# Patient Record
Sex: Female | Born: 1980 | Race: White | Hispanic: No | Marital: Single | Smoking: Current every day smoker
Health system: Southern US, Community
[De-identification: ages and names within clinical notes are randomized; demographics above are authoritative.]

## PROBLEM LIST (undated history)

## (undated) DIAGNOSIS — I10 Essential (primary) hypertension: Secondary | ICD-10-CM

## (undated) DIAGNOSIS — K219 Gastro-esophageal reflux disease without esophagitis: Secondary | ICD-10-CM

## (undated) HISTORY — PX: BACK SURGERY: SHX140

## (undated) HISTORY — DX: Gastro-esophageal reflux disease without esophagitis: K21.9

---

## 1999-09-09 ENCOUNTER — Emergency Department (HOSPITAL_COMMUNITY): Admission: EM | Admit: 1999-09-09 | Discharge: 1999-09-09 | Payer: Self-pay | Admitting: Emergency Medicine

## 2007-08-01 ENCOUNTER — Ambulatory Visit (HOSPITAL_COMMUNITY): Admission: RE | Admit: 2007-08-01 | Discharge: 2007-08-01 | Payer: Self-pay | Admitting: Neurological Surgery

## 2010-07-30 IMAGING — CR DG CHEST 2V
1 series · 2 of 2 positions shown · non-contrast
Comparison: NONE

CLINICAL DATA: Attn. TIGER, ABIMELK  Intermittent chest 
pain  for 3 weeks. 

CHEST TWO VIEW (PA AND LATERAL)

[Series 1: view not recorded · 0.17mm/px · 2 of 2 slices shown]
[im 1/2]
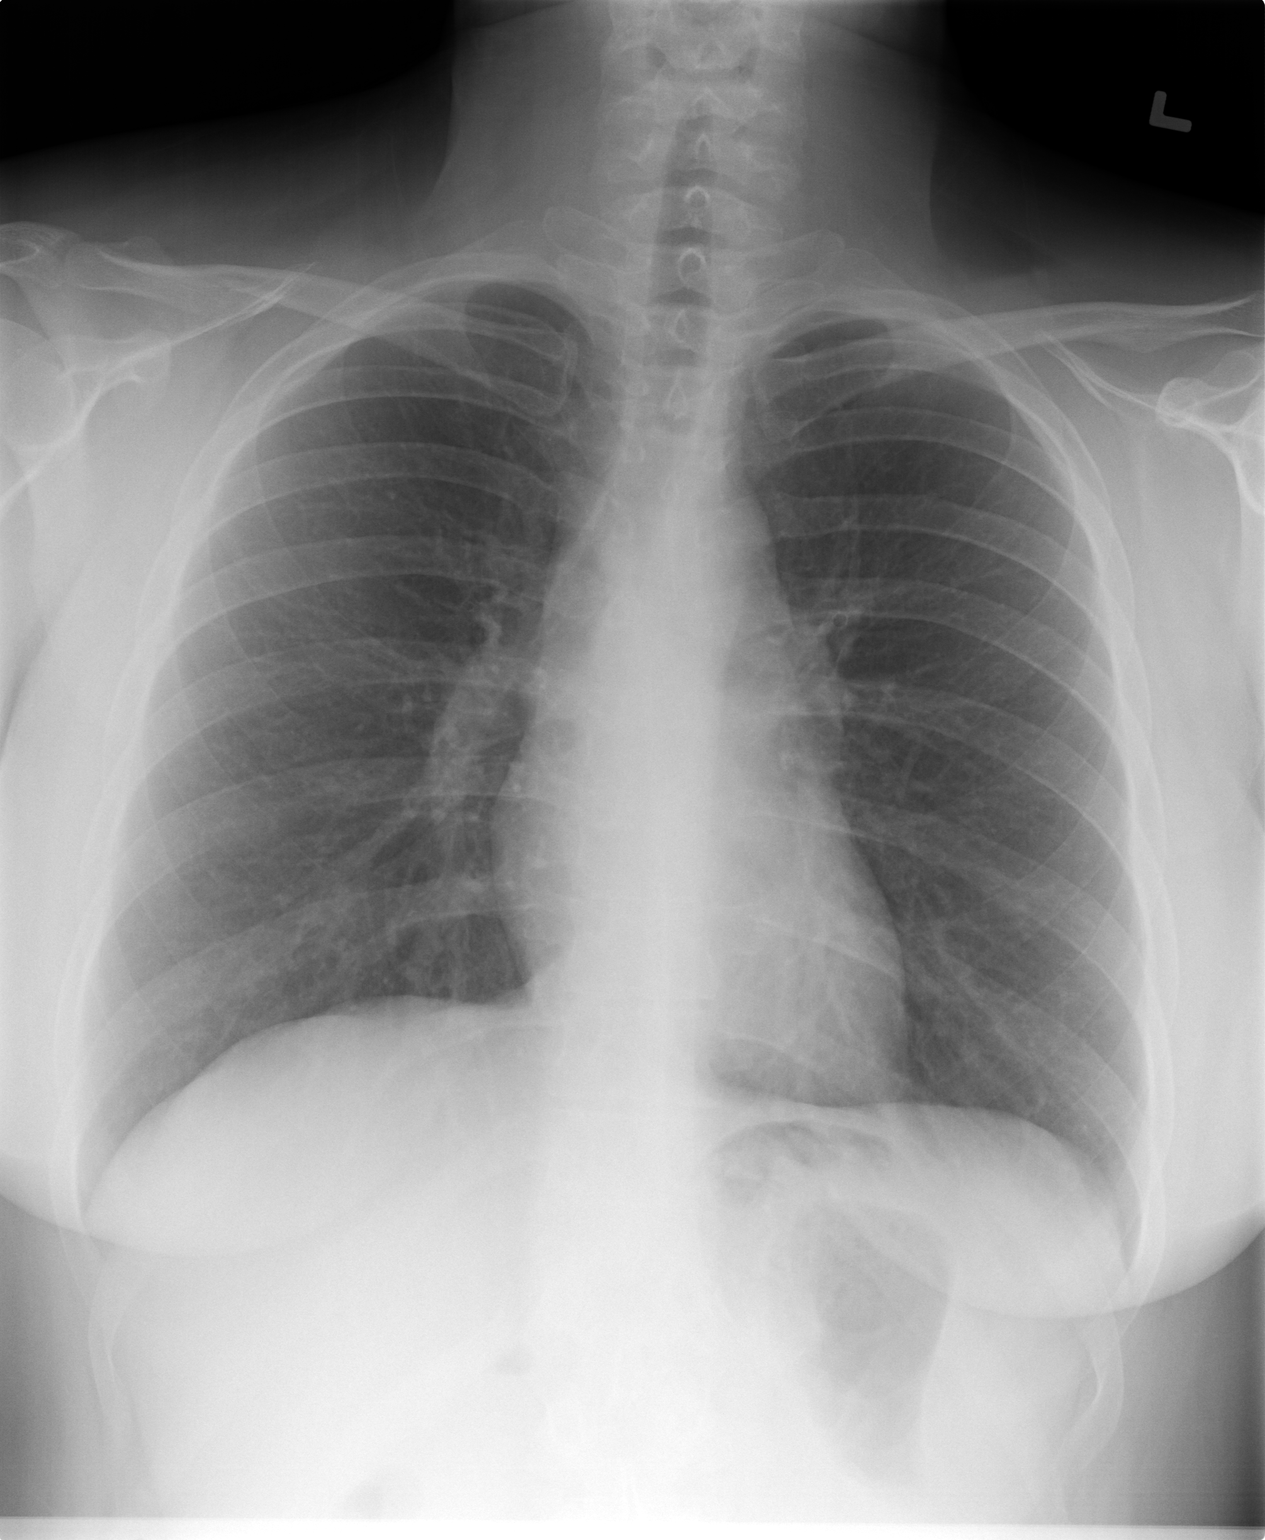
[im 2/2]
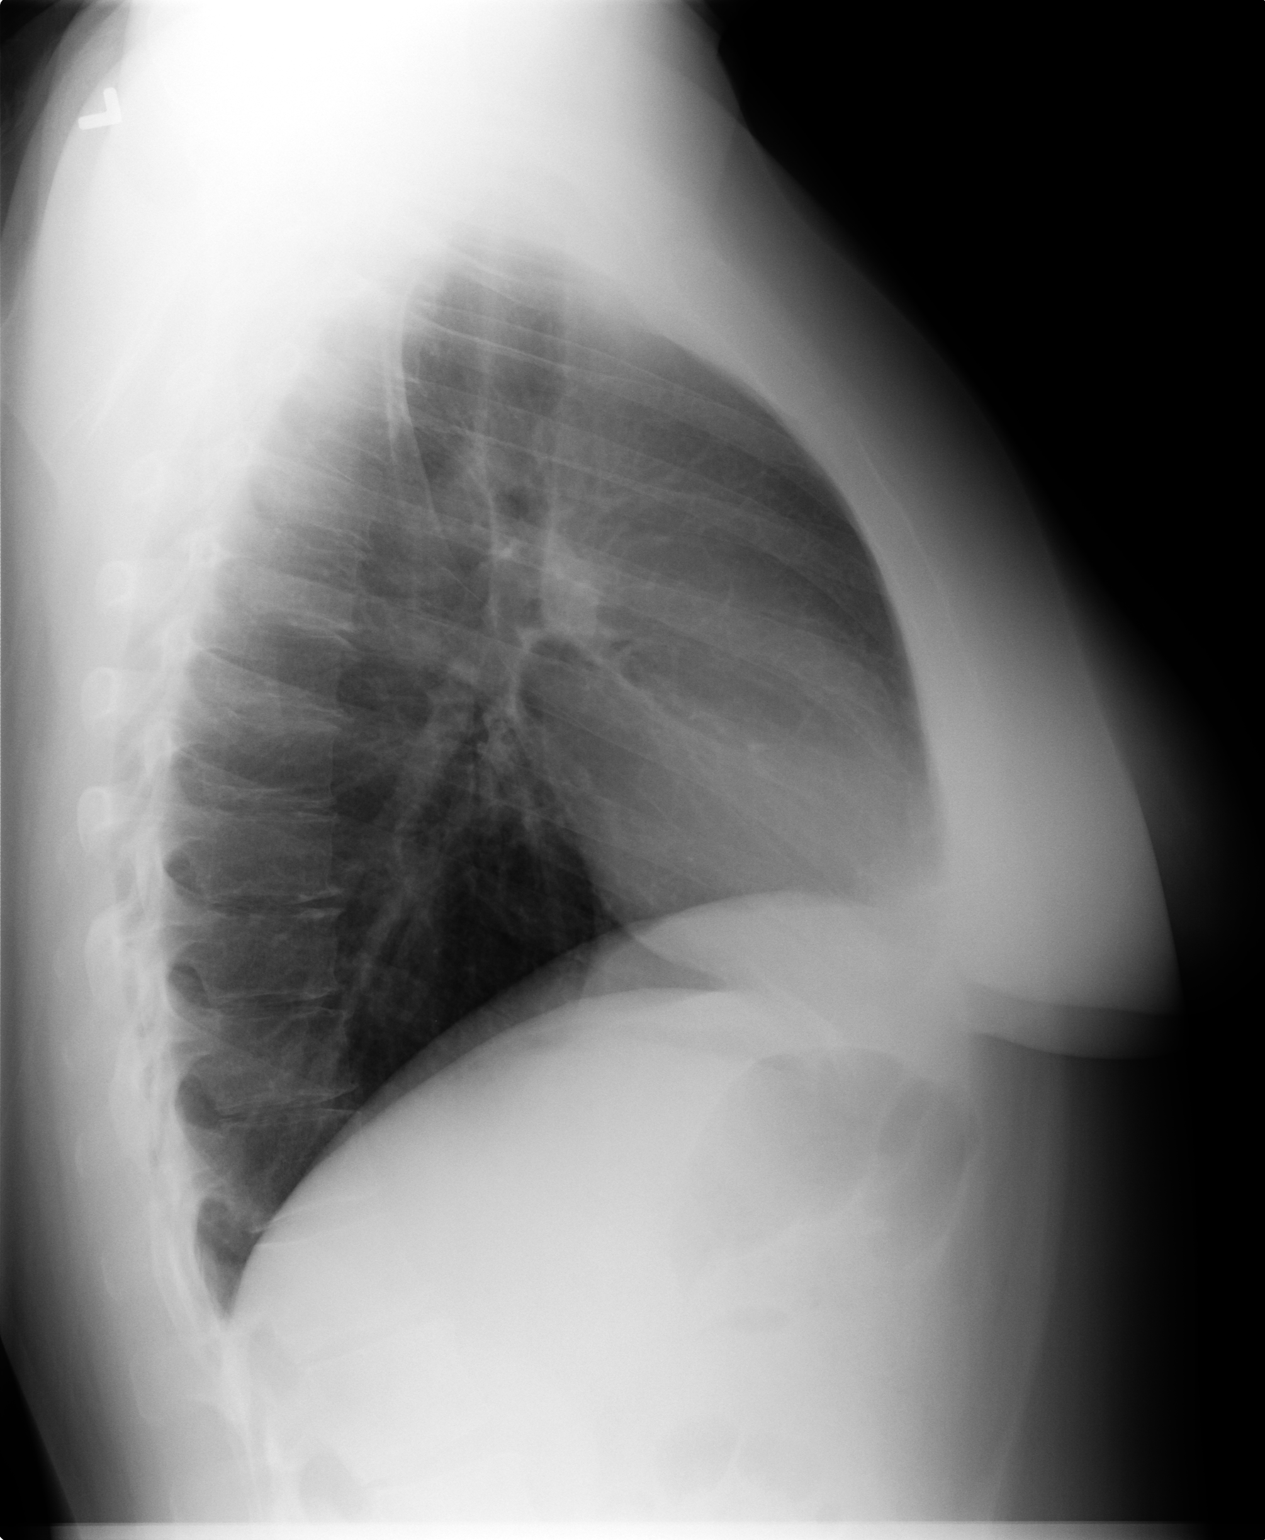

[2 of 2 positions shown; findings below may reference images not displayed]

FINDINGS: The lungs are clear and well expanded.   Heart and 
pulmonary vessels are normal.  No significant abnormalities are 
noted in the regional skeleton. 

electronically reviewed on 04/07/2008 Dict Date: 04/06/2008  Tran 
Date:  04/07/2008 DAS  [REDACTED]

## 2010-08-30 NOTE — Op Note (Signed)
NAMEJULIEANN, Barbara Romero NO.:  [id number]   MEDICAL RECORD NO.:  [id number]          PATIENT TYPE:  OIB   LOCATION:  3599                         FACILITY:  [facility]   PHYSICIAN:  Tia Alert, MD     DATE OF BIRTH:  05-05-1980   DATE OF PROCEDURE:  DATE OF DISCHARGE:                               OPERATIVE REPORT   PREOPERATIVE DIAGNOSIS:  Lumbar disk herniation, L5-S1 with left S1  radiculopathy.   POSTOPERATIVE DIAGNOSIS:  Lumbar disk herniation, L5-S1 with left S1  radiculopathy.   PROCEDURES:  Decompressive lumbar hemilaminectomy, medial facetectomy  and foraminotomy, L5-S1 with left followed by microdiskectomy, and L5-S1  with left utilizing microscopic dissection.   SURGEON:  Tia Alert, MD.   ASSISTANT:  Reinaldo Meeker, MD.   ANESTHESIA:  General endotracheal.   COMPLICATIONS:  None apparent.   INDICATIONS FOR PROCEDURE:  Barbara Romero is a 30 year old female who  presented with severe left leg pain in S1 distribution.  She had an MRI,  which showed a very large midline disk herniation at L5-S1, causing  severe canal stenosis and compression of the S1 nerve root.  I  recommended a microdiskectomy at L5-S1 left, in hopes of improving her  pain syndrome.  She understood the risks, benefits, and its expected  outcome and wished to proceed.   DESCRIPTION OF PROCEDURE:  The patient was taken to the operating room.  After induction of adequate generalized endotracheal anesthesia, she was  rolled onto prone position on the Wilson frame.  All pressure points  were padded.  The lumbar region was prepped with DuraPrep and then  draped in the usual sterile fashion.  A 5 mL of local anesthesia was  injected and a small dorsal midline incision was made and carried down  to the lumbosacral fascia.  The fascia was opened and the paraspinous  musculature was taken down in subperiosteal fashion to expose L5-S1 on  the left.  Intraoperative X-ray confirmed my  level, and then used the  high-speed drill and a Kerrison punch to perform a hemilaminectomy,  medial facetectomy, and foraminotomy at L5-S1 on the left side.  The  underlying yellow ligament was opened and removed in a piecemeal fashion  to expose the underlying dura and S1 nerve root.  The S1 nerve root was  retracted medially and utilizing microscopic dissection, the epidural  venous vasculature was coagulated and cut sharply and then, the disk was  incised and the diskectomy was done with pituitary rongeurs and Epstein  curettes.  There was a large midline herniation and that was removed  with an upbiting pituitary.  That relaxed the dura and the nerve root.  We then inspected with a nerve hook and a coronary dilator into the  midline and into the foramen.  We had a nice flat annulus with a nerve  root, which appeared to have no more compression.  We irrigated with  saline solution containing bacitracin, lined the dura with DuraGen to  help prevent epidural fibrosis, and then closed the fascia and the fatty  tissues with 0 Vicryl,  closed the subcutaneous and subcuticular  tissue with 2-0 and 3-0 Vicryl, closed the skin with Benzoin and Steri-  Strips.  The drapes were removed.  Sterile dressing was applied.  The  patient was awakened from general anesthesia and transferred to recovery  room in stable condition.  At the end of the procedure, all sponge,  needle, and instrument counts were correct.      Tia Alert, MD  Electronically Signed     DSJ/MEDQ  D:  08/01/2007  T:  08/02/2007  Job:  [id number]

## 2011-01-10 LAB — DIFFERENTIAL
Eosinophils Relative: 4
Lymphocytes Relative: 26
Lymphs Abs: 2.8

## 2011-01-10 LAB — BASIC METABOLIC PANEL
BUN: 11
GFR calc Af Amer: >60
GFR calc non Af Amer: >60
Potassium: 4.4
Sodium: 137

## 2011-01-10 LAB — HEPATIC FUNCTION PANEL
ALT: 25
Albumin: 3.8
Alkaline Phosphatase: 57
Indirect Bilirubin: 0.7
Total Protein: 7.3

## 2011-01-10 LAB — APTT: aPTT: 30

## 2011-01-10 LAB — CBC
HCT: 43
Platelets: 257
WBC: 10.9 — ABNORMAL HIGH

## 2013-06-29 ENCOUNTER — Emergency Department (HOSPITAL_COMMUNITY)
Admission: EM | Admit: 2013-06-29 | Discharge: 2013-06-29 | Disposition: A | Discharge status: Home / Self Care | Payer: BC Managed Care – PPO | Attending: Emergency Medicine | Admitting: Emergency Medicine

## 2013-06-29 ENCOUNTER — Encounter (HOSPITAL_COMMUNITY): Payer: Self-pay | Admitting: Emergency Medicine

## 2013-06-29 DIAGNOSIS — Z79899 Other long term (current) drug therapy: Secondary | ICD-10-CM | POA: Insufficient documentation

## 2013-06-29 DIAGNOSIS — N938 Other specified abnormal uterine and vaginal bleeding: Secondary | ICD-10-CM | POA: Insufficient documentation

## 2013-06-29 DIAGNOSIS — F17200 Nicotine dependence, unspecified, uncomplicated: Secondary | ICD-10-CM | POA: Insufficient documentation

## 2013-06-29 DIAGNOSIS — N949 Unspecified condition associated with female genital organs and menstrual cycle: Secondary | ICD-10-CM | POA: Insufficient documentation

## 2013-06-29 DIAGNOSIS — R109 Unspecified abdominal pain: Secondary | ICD-10-CM | POA: Insufficient documentation

## 2013-06-29 DIAGNOSIS — Z3202 Encounter for pregnancy test, result negative: Secondary | ICD-10-CM | POA: Insufficient documentation

## 2013-06-29 LAB — COMPREHENSIVE METABOLIC PANEL
ALBUMIN: 3.6 g/dL (ref 3.5–5.2)
ALT: 21 U/L (ref 0–35)
AST: 19 U/L (ref 0–37)
Alkaline Phosphatase: 68 U/L (ref 39–117)
BUN: 13 mg/dL (ref 6–23)
CALCIUM: 9.2 mg/dL (ref 8.4–10.5)
CO2: 22 mEq/L (ref 19–32)
Chloride: 101 mEq/L (ref 96–112)
Creatinine, Ser: 1.12 mg/dL — ABNORMAL HIGH (ref 0.50–1.10)
GFR calc non Af Amer: 63 mL/min — ABNORMAL LOW (ref >90)
GFR, EST AFRICAN AMERICAN: 73 mL/min — AB (ref >90)
GLUCOSE: 127 mg/dL — AB (ref 70–99)
POTASSIUM: 4.4 meq/L (ref 3.7–5.3)
SODIUM: 137 meq/L (ref 137–147)
TOTAL PROTEIN: 7.1 g/dL (ref 6.0–8.3)
Total Bilirubin: <0.2 mg/dL — ABNORMAL LOW (ref 0.3–1.2)

## 2013-06-29 LAB — URINALYSIS, ROUTINE W REFLEX MICROSCOPIC
Bilirubin Urine: NEGATIVE
Glucose, UA: NEGATIVE mg/dL
HGB URINE DIPSTICK: NEGATIVE
Ketones, ur: NEGATIVE mg/dL
Leukocytes, UA: NEGATIVE
NITRITE: NEGATIVE
PH: 7 (ref 5.0–8.0)
Protein, ur: NEGATIVE mg/dL
SPECIFIC GRAVITY, URINE: 1.023 (ref 1.005–1.030)
UROBILINOGEN UA: 1 mg/dL (ref 0.0–1.0)

## 2013-06-29 LAB — CBC WITH DIFFERENTIAL/PLATELET
BASOS ABS: 0 10*3/uL (ref 0.0–0.1)
BASOS PCT: 0 % (ref 0–1)
EOS ABS: 0.4 10*3/uL (ref 0.0–0.7)
EOS PCT: 4 % (ref 0–5)
HCT: 41.7 % (ref 36.0–46.0)
Hemoglobin: 14.4 g/dL (ref 12.0–15.0)
Lymphocytes Relative: 36 % (ref 12–46)
Lymphs Abs: 3.8 10*3/uL (ref 0.7–4.0)
MCH: 30.8 pg (ref 26.0–34.0)
MCHC: 34.5 g/dL (ref 30.0–36.0)
MCV: 89.3 fL (ref 78.0–100.0)
Monocytes Absolute: 0.7 10*3/uL (ref 0.1–1.0)
Monocytes Relative: 7 % (ref 3–12)
NEUTROS PCT: 53 % (ref 43–77)
Neutro Abs: 5.5 10*3/uL (ref 1.7–7.7)
PLATELETS: 265 10*3/uL (ref 150–400)
RBC: 4.67 MIL/uL (ref 3.87–5.11)
RDW: 13.1 % (ref 11.5–15.5)
WBC: 10.4 10*3/uL (ref 4.0–10.5)

## 2013-06-29 LAB — PREGNANCY, URINE: PREG TEST UR: NEGATIVE

## 2013-06-29 LAB — LIPASE, BLOOD: Lipase: 27 U/L (ref 11–59)

## 2013-06-29 MED ORDER — OXYCODONE-ACETAMINOPHEN 5-325 MG PO TABS: 1.0000 | ORAL_TABLET | Freq: Four times a day (QID) | ORAL | Status: DC | PRN

## 2013-06-29 MED ORDER — IBUPROFEN 600 MG PO TABS: 600.0000 mg | ORAL_TABLET | Freq: Four times a day (QID) | ORAL | Status: DC | PRN

## 2013-06-29 NOTE — ED Notes (Signed)
Pt from home reports intermittent abd pain x2 months. Pt has not been to her PCP, but wants to be checked for cause. Pt reports LLQ pain 0/10 lying still, 10/10 if she sits up. Pt last BM was today. Pt is A&O and in NAD

## 2013-06-29 NOTE — ED Provider Notes (Signed)
CSN: [id number]     Arrival date & time 06/29/13  1357 History   First MD Initiated Contact with Patient 06/29/13 1415     Chief Complaint  Patient presents with  . Abdominal Pain     (Consider location/radiation/quality/duration/timing/severity/associated sxs/prior Treatment) Patient is a 33 y.o. female presenting with abdominal pain. The history is provided by the patient.  Abdominal Pain Associated symptoms: vaginal bleeding   Associated symptoms: no chest pain, no diarrhea, no nausea, no shortness of breath and no vomiting    patient's had right-sided abdominal pain for the last month or 2. It is worse with movement and with certain positions. No trauma. She states she had similar symptoms several months ago that resolved spontaneously. It is not associated eating. No nausea vomiting diarrhea. Constipation. No dysuria. No vaginal discharge. She states she has had extra bleeding and may be having 2 periods a month. No rash. She's had previous back surgery, but states this feels different.  History reviewed. No pertinent past medical history. Past Surgical History  Procedure Laterality Date  . Back surgery      L5   No family history on file. History  Substance Use Topics  . Smoking status: Current Every Day Smoker -- 1.00 packs/day    Types: Cigarettes  . Smokeless tobacco: Not on file  . Alcohol Use: Yes     Comment: social   OB History   Grav Para Term Preterm Abortions TAB SAB Ect Mult Living                 Review of Systems  Constitutional: Negative for activity change and appetite change.  Eyes: Negative for pain.  Respiratory: Negative for chest tightness and shortness of breath.   Cardiovascular: Negative for chest pain and leg swelling.  Gastrointestinal: Positive for abdominal pain. Negative for nausea, vomiting and diarrhea.  Genitourinary: Positive for flank pain and vaginal bleeding.  Musculoskeletal: Negative for back pain and neck stiffness.  Skin:  Negative for rash.  Neurological: Negative for weakness, numbness and headaches.  Psychiatric/Behavioral: Negative for behavioral problems.      Allergies  Review of patient's allergies indicates no known allergies.  Home Medications   Current Outpatient Rx  Name  Route  Sig  Dispense  Refill  . ALPRAZolam (XANAX) 0.5 MG tablet   Oral   Take 0.5 mg by mouth once.         Marland Kitchen. aspirin 325 MG tablet   Oral   Take 325 mg by mouth every 4 (four) hours as needed for mild pain or headache.         . guaiFENesin (MUCINEX) 600 MG 12 hr tablet   Oral   Take 600 mg by mouth 2 (two) times daily as needed for cough.         Marland Kitchen. ibuprofen (ADVIL,MOTRIN) 600 MG tablet   Oral   Take 1 tablet (600 mg total) by mouth every 6 (six) hours as needed.   20 tablet   0   . oxyCODONE-acetaminophen (PERCOCET/ROXICET) 5-325 MG per tablet   Oral   Take 1-2 tablets by mouth every 6 (six) hours as needed for severe pain.   10 tablet   0    BP 134/65  Pulse 100  Temp(Src) 99.2 F (37.3 C) (Oral)  Resp 20  SpO2 97%  LMP 06/15/2013 Physical Exam  Nursing note and vitals reviewed. Constitutional: She is oriented to person, place, and time. She appears well-developed and well-nourished.  HENT:  Head: Normocephalic and atraumatic.  Eyes: EOM are normal. Pupils are equal, round, and reactive to light.  Neck: Normal range of motion.  Cardiovascular: Normal rate, regular rhythm and normal heart sounds.   No murmur heard. Pulmonary/Chest: Effort normal and breath sounds normal. No respiratory distress. She has no wheezes. She has no rales.  Abdominal: Soft. Bowel sounds are normal. She exhibits no distension. There is no tenderness. There is no rebound and no guarding.  Genitourinary:  No CVA tenderness.  Musculoskeletal: Normal range of motion. She exhibits tenderness.  Tenderness over superior iliac spine on left. No rash. Pain worse with movement.  Neurological: She is alert and oriented to  person, place, and time. No cranial nerve deficit.  Skin: Skin is warm and dry.  Psychiatric: She has a normal mood and affect. Her speech is normal.    ED Course  Procedures (including critical care time) Labs Review Labs Reviewed  COMPREHENSIVE METABOLIC PANEL - Abnormal; Notable for the following:    Glucose, Bld 127 (*)    Creatinine, Ser 1.12 (*)    Total Bilirubin <0.2 (*)    GFR calc non Af Amer 63 (*)    GFR calc Af Amer 73 (*)    All other components within normal limits  URINALYSIS, ROUTINE W REFLEX MICROSCOPIC - Abnormal; Notable for the following:    APPearance CLOUDY (*)    All other components within normal limits  CBC WITH DIFFERENTIAL  LIPASE, BLOOD  PREGNANCY, URINE   Imaging Review No results found.   EKG Interpretation None      MDM   Final diagnoses:  Flank pain    Patient with pain in her left flank. No tenderness over abdomen or pelvis. It appears be centered over the superior iliac crest. Maybe some irritation in this area. No rash. Worse with movement. Will discharge home with anti-inflammatories and pain. Does not appear to be intra-abdominal pathology at this time, however patient was given followup instructions    Juliet Rude. Rubin Payor, MD 06/29/13 1546

## 2013-06-29 NOTE — ED Notes (Signed)
She c/o recurrent, intermittant llq area abd. Pain x 3 months.  She also states she has had a second "lighter" period each month for same interval.  She is in no distress.

## 2013-06-29 NOTE — Discharge Instructions (Signed)
The pain seems to be in the area where your muscle goes on to your hip bone. May be related to inflammation the site. Take ibuprofen and Percocet as needed for pain. Followup with her primary care doctor if the symptoms did not improve.  Flank Pain Flank pain refers to pain that is located on the side of the body between the upper abdomen and the back. The pain may occur over a short period of time (acute) or may be long-term or reoccurring (chronic). It may be mild or severe. Flank pain can be caused by many things. CAUSES  Some of the more common causes of flank pain include:  Muscle strains.   Muscle spasms.   A disease of your spine (vertebral disk disease).   A lung infection (pneumonia).   Fluid around your lungs (pulmonary edema).   A kidney infection.   Kidney stones.   A very painful skin rash caused by the chickenpox virus (shingles).   Gallbladder disease.  HOME CARE INSTRUCTIONS  Home care will depend on the cause of your pain. In general,  Rest as directed by your caregiver.  Drink enough fluids to keep your urine clear or pale yellow.  Only take over-the-counter or prescription medicines as directed by your caregiver. Some medicines may help relieve the pain.  Tell your caregiver about any changes in your pain.  Follow up with your caregiver as directed. SEEK IMMEDIATE MEDICAL CARE IF:   Your pain is not controlled with medicine.   You have new or worsening symptoms.  Your pain increases.   You have abdominal pain.   You have shortness of breath.   You have persistent nausea or vomiting.   You have swelling in your abdomen.   You feel faint or pass out.   You have blood in your urine.  You have a fever or persistent symptoms for more than 2 3 days.  You have a fever and your symptoms suddenly get worse. MAKE SURE YOU:   Understand these instructions.  Will watch your condition.  Will get help right away if you are not  doing well or get worse. Document Released: 05/25/2005 Document Revised: 12/27/2011 Document Reviewed: 11/16/2011 [facility] Patient Information 2014 [employer], [place].

## 2013-10-08 ENCOUNTER — Ambulatory Visit (INDEPENDENT_AMBULATORY_CARE_PROVIDER_SITE_OTHER): Payer: BC Managed Care – PPO | Admitting: Emergency Medicine

## 2013-10-08 VITALS — BP 128/88 | HR 94 | Temp 98.0°F | Resp 18 | Ht 66.5 in | Wt 282.6 lb

## 2013-10-08 DIAGNOSIS — F411 Generalized anxiety disorder: Secondary | ICD-10-CM

## 2013-10-08 DIAGNOSIS — J018 Other acute sinusitis: Secondary | ICD-10-CM

## 2013-10-08 DIAGNOSIS — G4733 Obstructive sleep apnea (adult) (pediatric): Secondary | ICD-10-CM

## 2013-10-08 MED ORDER — LORAZEPAM 1 MG PO TABS: 1.0000 mg | ORAL_TABLET | Freq: Two times a day (BID) | ORAL | Status: DC | PRN

## 2013-10-08 MED ORDER — PSEUDOEPHEDRINE-GUAIFENESIN ER 60-600 MG PO TB12: 1.0000 | ORAL_TABLET | Freq: Two times a day (BID) | ORAL | Status: AC

## 2013-10-08 MED ORDER — AMOXICILLIN-POT CLAVULANATE 875-125 MG PO TABS: 1.0000 | ORAL_TABLET | Freq: Two times a day (BID) | ORAL | Status: DC

## 2013-10-08 MED ORDER — PAROXETINE HCL 20 MG PO TABS: 20.0000 mg | ORAL_TABLET | Freq: Every day | ORAL | Status: DC

## 2013-10-08 NOTE — Patient Instructions (Signed)
Generalized Anxiety Disorder  Generalized anxiety disorder (GAD) is a mental disorder. It interferes with life functions, including relationships, work, and school.  GAD is different from normal anxiety, which everyone experiences at some point in their lives in response to specific life events and activities. Normal anxiety actually helps us prepare for and get through these life events and activities. Normal anxiety goes away after the event or activity is over.   GAD causes anxiety that is not necessarily related to specific events or activities. It also causes excess anxiety in proportion to specific events or activities. The anxiety associated with GAD is also difficult to control. GAD can vary from mild to severe. People with severe GAD can have intense waves of anxiety with physical symptoms (panic attacks).   SYMPTOMS  The anxiety and worry associated with GAD are difficult to control. This anxiety and worry are related to many life events and activities and also occur more days than not for 6 months or longer. People with GAD also have three or more of the following symptoms (one or more in children):  · Restlessness.    · Fatigue.  · Difficulty concentrating.    · Irritability.  · Muscle tension.  · Difficulty sleeping or unsatisfying sleep.  DIAGNOSIS  GAD is diagnosed through an assessment by your caregiver. Your caregiver will ask you questions about your mood, physical symptoms, and events in your life. Your caregiver may ask you about your medical history and use of alcohol or drugs, including prescription medications. Your caregiver may also do a physical exam and blood tests. Certain medical conditions and the use of certain substances can cause symptoms similar to those associated with GAD. Your caregiver may refer you to a mental health specialist for further evaluation.  TREATMENT  The following therapies are usually used to treat GAD:   · Medication--Antidepressant medication usually is  prescribed for long-term daily control. Antianxiety medications may be added in severe cases, especially when panic attacks occur.    · Talk therapy (psychotherapy)--Certain types of talk therapy can be helpful in treating GAD by providing support, education, and guidance. A form of talk therapy called cognitive behavioral therapy can teach you healthy ways to think about and react to daily life events and activities.  · Stress management techniques--These include yoga, meditation, and exercise and can be very helpful when they are practiced regularly.  A mental health specialist can help determine which treatment is best for you. Some people see improvement with one therapy. However, other people require a combination of therapies.  Document Released: 07/29/2012 Document Reviewed: 07/29/2012  ExitCare® Patient Information ©2015 [employer]. This information is not intended to replace advice given to you by your health care provider. Make sure you discuss any questions you have with your health care provider.

## 2013-10-08 NOTE — Progress Notes (Signed)
[facility] [address], [place] [id number] [phone]  Date:  10/08/2013   Name:  Barbara Romero   DOB:  Sep 13, 1980   MRN:  [id number]  PCP:  No PCP Per Patient    Chief Complaint: Anxiety   History of Present Illness:  Barbara Romero is a 33 y.o. very pleasant female patient who presents with the following:  Ill with numerous complaints.  Has interrupted sleep and daytime sleepiness related to heavy snoring for long term.  Previously scheduled for sleep study but missed it due to snow.   Has nasal congestion and post nasal drainage that is purulent in nature.  No fever or chills, cough, nausea or vomiting.  No stool change or rash.  No improvement with over the counter medications or other home remedies.  Has panic attacks since undergoing a wisdom tooth extraction a month ago.  Not sleeping well and panic when she wakes up believing she cannot breath through her nose and frequently during the day for no reason. Denies other complaint or health concern today.   There are no active problems to display for this patient.   History reviewed. No pertinent past medical history.  Past Surgical History  Procedure Laterality Date  . Back surgery      L5    History  Substance Use Topics  . Smoking status: Current Every Day Smoker -- 1.00 packs/day    Types: Cigarettes  . Smokeless tobacco: Not on file  . Alcohol Use: Yes     Comment: social    Family History  Problem Relation Age of Onset  . Cancer Father   . Hypertension Father     No Active Allergies  Medication list has been reviewed and updated.  Current Outpatient Prescriptions on File Prior to Visit  Medication Sig Dispense Refill  . aspirin 325 MG tablet Take 325 mg by mouth every 4 (four) hours as needed for mild pain or headache.      . ibuprofen (ADVIL,MOTRIN) 600 MG tablet Take 1 tablet (600 mg total) by mouth every 6 (six) hours as needed.  20 tablet  0  . ALPRAZolam (XANAX) 0.5  MG tablet Take 0.5 mg by mouth once.      Marland Kitchen. guaiFENesin (MUCINEX) 600 MG 12 hr tablet Take 600 mg by mouth 2 (two) times daily as needed for cough.      Marland Kitchen. oxyCODONE-acetaminophen (PERCOCET/ROXICET) 5-325 MG per tablet Take 1-2 tablets by mouth every 6 (six) hours as needed for severe pain.  10 tablet  0   No current facility-administered medications on file prior to visit.    Review of Systems:  As per HPI, otherwise negative.    Physical Examination: Filed Vitals:   10/08/13 1725  BP: 128/88  Pulse: 94  Temp: 98 F (36.7 C)  Resp: 18   Filed Vitals:   10/08/13 1725  Height: 5' 6.5" (1.689 m)  Weight: 282 lb 9.6 oz (128.187 kg)   Body mass index is 44.93 kg/(m^2). Ideal Body Weight: Weight in (lb) to have BMI = 25: 156.9  GEN: WDWN, NAD, Non-toxic, A & O x 3 HEENT: Atraumatic, Normocephalic. Neck supple. No masses, No LAD. Ears and Nose: No external deformity. CV: RRR, No M/G/R. No JVD. No thrill. No extra heart sounds. PULM: CTA B, no wheezes, crackles, rhonchi. No retractions. No resp. distress. No accessory muscle use. ABD: S, NT, ND, +BS. No rebound. No HSM. EXTR: No c/c/e NEURO Normal gait.  PSYCH: Normally interactive. Conversant. Not depressed or anxious appearing.  Calm demeanor.    Assessment and Plan: Sinusitis mucinex  augmentin OSA Sleep study Panic disorder Ativan paxil   Signed,  Phillips OdorJeffery Anderson, MD

## 2016-01-14 ENCOUNTER — Encounter (HOSPITAL_COMMUNITY): Payer: Self-pay

## 2016-01-14 ENCOUNTER — Emergency Department (HOSPITAL_COMMUNITY)
Admission: EM | Admit: 2016-01-14 | Discharge: 2016-01-14 | Disposition: A | Discharge status: Home / Self Care | Payer: BLUE CROSS/BLUE SHIELD | Attending: Emergency Medicine | Admitting: Emergency Medicine

## 2016-01-14 ENCOUNTER — Emergency Department (HOSPITAL_COMMUNITY): Payer: BLUE CROSS/BLUE SHIELD

## 2016-01-14 DIAGNOSIS — Z79899 Other long term (current) drug therapy: Secondary | ICD-10-CM | POA: Insufficient documentation

## 2016-01-14 DIAGNOSIS — R1011 Right upper quadrant pain: Secondary | ICD-10-CM | POA: Insufficient documentation

## 2016-01-14 DIAGNOSIS — F17210 Nicotine dependence, cigarettes, uncomplicated: Secondary | ICD-10-CM | POA: Diagnosis not present

## 2016-01-14 DIAGNOSIS — Z7982 Long term (current) use of aspirin: Secondary | ICD-10-CM | POA: Diagnosis not present

## 2016-01-14 LAB — CBC
HCT: 47.8 % — ABNORMAL HIGH (ref 36.0–46.0)
Hemoglobin: 16.1 g/dL — ABNORMAL HIGH (ref 12.0–15.0)
MCH: 30 pg (ref 26.0–34.0)
MCHC: 33.7 g/dL (ref 30.0–36.0)
MCV: 89 fL (ref 78.0–100.0)
PLATELETS: 255 10*3/uL (ref 150–400)
RBC: 5.37 MIL/uL — AB (ref 3.87–5.11)
RDW: 13.6 % (ref 11.5–15.5)
WBC: 11.2 10*3/uL — ABNORMAL HIGH (ref 4.0–10.5)

## 2016-01-14 LAB — COMPREHENSIVE METABOLIC PANEL
ALK PHOS: 60 U/L (ref 38–126)
ALT: 16 U/L (ref 14–54)
AST: 17 U/L (ref 15–41)
Albumin: 4.5 g/dL (ref 3.5–5.0)
Anion gap: 9 (ref 5–15)
BUN: 18 mg/dL (ref 6–20)
CALCIUM: 9.3 mg/dL (ref 8.9–10.3)
CO2: 21 mmol/L — AB (ref 22–32)
CREATININE: 0.84 mg/dL (ref 0.44–1.00)
Chloride: 107 mmol/L (ref 101–111)
GFR calc non Af Amer: >60 mL/min (ref >60)
GLUCOSE: 101 mg/dL — AB (ref 65–99)
Potassium: 4.1 mmol/L (ref 3.5–5.1)
SODIUM: 137 mmol/L (ref 135–145)
Total Bilirubin: 0.7 mg/dL (ref 0.3–1.2)
Total Protein: 8.3 g/dL — ABNORMAL HIGH (ref 6.5–8.1)

## 2016-01-14 LAB — LIPASE, BLOOD: LIPASE: 20 U/L (ref 11–51)

## 2016-01-14 MED ORDER — IBUPROFEN 800 MG PO TABS
800.0000 mg | ORAL_TABLET | Freq: Once | ORAL | Status: AC
  Administered 2016-01-14: 800 mg via ORAL
  Filled 2016-01-14: qty 1

## 2016-01-14 MED ORDER — CYCLOBENZAPRINE HCL 10 MG PO TABS: 10.0000 mg | ORAL_TABLET | Freq: Two times a day (BID) | ORAL | 0 refills | Status: DC | PRN

## 2016-01-14 MED ORDER — SODIUM CHLORIDE 0.9 % IV BOLUS (SEPSIS)
1000.0000 mL | Freq: Once | INTRAVENOUS | Status: AC
  Administered 2016-01-14: 1000 mL via INTRAVENOUS

## 2016-01-14 NOTE — ED Provider Notes (Signed)
Medical screening examination/treatment/procedure(s) were conducted as a shared visit with non-physician practitioner(s) and myself.  I personally evaluated the patient during the encounter. Briefly, the patient is a 35 y.o. female patient here with 4 weeks of right upper quadrant/right flank pain following extraneous movement. Patient has been consistent however exacerbated with abdominal movements. Patient here concern for possible gallbladder disease. Abdomen with right lateral abdominal wall regional pain and tenderness. No obvious hernia palpable. Otherwise abdomen benign. Presentation consistent with acute cholecystitis however will obtain screening labs and bedside ultrasound to confirm. Screening labs reassuring. Bedside ultrasound without evidence of acute cholecystitis or cholelithiasis. Presentation is likely secondary to muscle strain.   EMERGENCY DEPARTMENT BILIARY ULTRASOUND INTERPRETATION "Study: Limited Abdominal Ultrasound of the gallbladder and common bile duct."  INDICATIONS: Abdominal pain Indication: Multiple views of the gallbladder and common bile duct were obtained in real-time with a Multi-frequency probe." PERFORMED BY:  Myself and Midlevel IMAGES ARCHIVED?: Yes FINDINGS: Gallstones absent, Gallbladder wall normal in thickness, Sonographic Murphy's sign absent and Common bile duct normal in size LIMITATIONS: Body Habitus and Bowel Gas INTERPRETATION: Normal  CPT Code (972) [id number] (limited abdominal)  Patient is safe for discharge with strict return precautions.   Nira ConnPedro Eduardo Cardama, MD 01/14/16 [phone]

## 2016-01-14 NOTE — ED Provider Notes (Signed)
[facility]-EMERGENCY DEPT Provider Note   CSN: [id number] Arrival date & time: 01/14/16  1019     History   Chief Complaint Chief Complaint  Patient presents with  . Abdominal Pain    HPI Barbara Romero is a 35 y.o. female.  35 year old Caucasian female no significant past medical history presents to the ED today with right upper quadrant pain. Patient states the pain started approximately 4-1/2 weeks ago. She states that she tripped over her cord at home and her right knee when up towards her right abdomen. Patient states that the pain is worse with movement. She has tried some Flexeril at home with some relief. Patient states the pain is sharp and intermittent. Patient also states that coughing makes the pain worse. She states that she was feeling better this morning. However she ate bacon and eggs and the pain got worse which is why she came in. She was concerned it was her gallbladder. Patient denies any fever, chills, headache, vision changes, lightheadedness, dizziness, chest pain, shortness of breath, urinary symptoms, change in bowel habits, hematochezia, melena, vaginal bleeding, vaginal discharge, numbness/tingling.      History reviewed. No pertinent past medical history.  There are no active problems to display for this patient.   Past Surgical History:  Procedure Laterality Date  . BACK SURGERY     L5    OB History    No data available       Home Medications    Prior to Admission medications   Medication Sig Start Date End Date Taking? Authorizing Provider  ALPRAZolam Prudy Feeler(XANAX) 0.5 MG tablet Take 0.5 mg by mouth once.    Historical Provider, MD  amoxicillin-clavulanate (AUGMENTIN) 875-125 MG per tablet Take 1 tablet by mouth 2 (two) times daily. 10/08/13   Carmelina DaneJeffery S Anderson, MD  aspirin 325 MG tablet Take 325 mg by mouth every 4 (four) hours as needed for mild pain or headache.    Historical Provider, MD  guaiFENesin (MUCINEX) 600 MG 12 hr tablet Take 600 mg by  mouth 2 (two) times daily as needed for cough.    Historical Provider, MD  ibuprofen (ADVIL,MOTRIN) 600 MG tablet Take 1 tablet (600 mg total) by mouth every 6 (six) hours as needed. 06/29/13   Benjiman CoreNathan Pickering, MD  LORazepam (ATIVAN) 1 MG tablet Take 1 tablet (1 mg total) by mouth 2 (two) times daily as needed for anxiety. 10/08/13   Carmelina DaneJeffery S Anderson, MD  oxyCODONE-acetaminophen (PERCOCET/ROXICET) 5-325 MG per tablet Take 1-2 tablets by mouth every 6 (six) hours as needed for severe pain. 06/29/13   Benjiman CoreNathan Pickering, MD  PARoxetine (PAXIL) 20 MG tablet Take 1 tablet (20 mg total) by mouth daily. 10/08/13   Carmelina DaneJeffery S Anderson, MD    Family History Family History  Problem Relation Age of Onset  . Cancer Father   . Hypertension Father     Social History Social History  Substance Use Topics  . Smoking status: Current Every Day Smoker    Packs/day: 1.00    Types: Cigarettes  . Smokeless tobacco: Never Used  . Alcohol use Yes     Comment: social     Allergies   Review of patient's allergies indicates no active allergies.   Review of Systems Review of Systems  Constitutional: Negative for chills and fever.  HENT: Negative for ear pain and sore throat.   Eyes: Negative for pain and visual disturbance.  Respiratory: Negative for cough and shortness of breath.   Cardiovascular: Negative for  chest pain and palpitations.  Gastrointestinal: Negative for abdominal pain and vomiting.  Genitourinary: Negative for dysuria and hematuria.  Musculoskeletal: Negative for arthralgias and back pain.  Skin: Negative for color change and rash.  Neurological: Negative for seizures and syncope.  All other systems reviewed and are negative.    Physical Exam Updated Vital Signs BP 150/96 (BP Location: Left Arm)   Pulse 83   Temp 98.6 F (37 C) (Oral)   Resp 20   LMP 01/07/2016   SpO2 98%   Physical Exam  Constitutional: She appears well-developed and well-nourished. No distress.  HENT:    Head: Normocephalic and atraumatic.  Mouth/Throat: Oropharynx is clear and moist.  Eyes: Conjunctivae are normal. Right eye exhibits no discharge. Left eye exhibits no discharge. No scleral icterus.  Neck: Normal range of motion. Neck supple. No thyromegaly present.  Cardiovascular: Normal rate, regular rhythm, normal heart sounds and intact distal pulses.   Pulmonary/Chest: Effort normal and breath sounds normal. No respiratory distress. She has no wheezes.  Abdominal: Soft. Bowel sounds are normal. She exhibits no distension. There is tenderness in the right upper quadrant. There is no rigidity, no rebound, no guarding, no CVA tenderness, no tenderness at McBurney's point and negative Murphy's sign.  Musculoskeletal: Normal range of motion.  Lymphadenopathy:    She has no cervical adenopathy.  Neurological: She is alert.  Skin: Skin is warm and dry. Capillary refill takes less than 2 seconds.  Vitals reviewed.    ED Treatments / Results  Labs (all labs ordered are listed, but only abnormal results are displayed) Labs Reviewed  COMPREHENSIVE METABOLIC PANEL - Abnormal; Notable for the following:       Result Value   CO2 21 (*)    Glucose, Bld 101 (*)    Total Protein 8.3 (*)    All other components within normal limits  CBC - Abnormal; Notable for the following:    WBC 11.2 (*)    RBC 5.37 (*)    Hemoglobin 16.1 (*)    HCT 47.8 (*)    All other components within normal limits  LIPASE, BLOOD    EKG  EKG Interpretation None       Radiology No results found.  Procedures Procedures (including critical care time)  Medications Ordered in ED Medications  sodium chloride 0.9 % bolus 1,000 mL (0 mLs Intravenous Stopped 01/14/16 1523)  ibuprofen (ADVIL,MOTRIN) tablet 800 mg (800 mg Oral Given 01/14/16 1522)     Initial Impression / Assessment and Plan / ED Course  I have reviewed the triage vital signs and the nursing notes.  Pertinent labs & imaging results that were  available during my care of the patient were reviewed by me and considered in my medical decision making (see chart for details).  Clinical Course  Patient presents with RUQ and right abdominal wall pain for the past 4 weeks following extraneous movement. Patient is nontoxic, nonseptic appearing, in no apparent distress. Exam without concern for peritonitis or appendicitis. Patient's pain and other symptoms adequately managed in emergency department.  Fluid bolus given.  Labs and vs consistent with dehydration. IV fluids given with significant improvement. Labs, imaging and vitals reviewed.  Lipase normal. No signs of palpable hernia. RUQ bedside ultrasound revealed not cholelithiasis or cholecystitis.Pain likely secondary to muscle strain. Patient discharged home with symptomatic treatment and given strict instructions for follow-up with their primary care physician.  BP slightly elevated. No history of HTN. Patient without headache or vision changes. Encouraged to  follow up with pcp. I have also discussed reasons to return immediately to the ER.  Patient expresses understanding and agrees with plan. Patient was seen and evaluated by Dr. Eudelia Bunch who is agreeable to plan. Hemodynamically stable. Discharged home in NAD with stable VS.      Final Clinical Impressions(s) / ED Diagnoses   Final diagnoses:  RUQ abdominal pain    New Prescriptions Discharge Medication List as of 01/14/2016  3:13 PM       Rise Mu, PA-C 01/14/16 2058

## 2016-01-14 NOTE — Discharge Instructions (Signed)
All of your labs and reassuring today. Ultrasound of your gallbladder was normal. Continue drinking plenty of water. Take ibuprofen and Tylenol for pain. May use a heating pad to help. Please follow-up with primary care doctor if symptoms continue. Return to the ED if he develops fevers, worsening abdominal pain, urinary symptoms or vomiting.

## 2016-01-14 NOTE — ED Triage Notes (Signed)
Pt c/o intermittent, sharp RUQ pain x 4.5 weeks.  Pain score 8/10 w/ movement.  Denies n/v/d.  Pt reports pain started after pulling her R knee toward her chest.  Pt has not taken anything for pain.

## 2016-02-25 ENCOUNTER — Encounter: Payer: Self-pay | Admitting: Family Medicine

## 2016-02-25 ENCOUNTER — Ambulatory Visit (INDEPENDENT_AMBULATORY_CARE_PROVIDER_SITE_OTHER): Payer: BLUE CROSS/BLUE SHIELD | Admitting: Family Medicine

## 2016-02-25 VITALS — BP 134/92 | HR 94 | Ht 66.5 in | Wt 302.3 lb

## 2016-02-25 DIAGNOSIS — I10 Essential (primary) hypertension: Secondary | ICD-10-CM | POA: Insufficient documentation

## 2016-02-25 DIAGNOSIS — F411 Generalized anxiety disorder: Secondary | ICD-10-CM

## 2016-02-25 DIAGNOSIS — R030 Elevated blood-pressure reading, without diagnosis of hypertension: Secondary | ICD-10-CM

## 2016-02-25 DIAGNOSIS — G4733 Obstructive sleep apnea (adult) (pediatric): Secondary | ICD-10-CM | POA: Insufficient documentation

## 2016-02-25 DIAGNOSIS — F4010 Social phobia, unspecified: Secondary | ICD-10-CM

## 2016-02-25 DIAGNOSIS — K219 Gastro-esophageal reflux disease without esophagitis: Secondary | ICD-10-CM

## 2016-02-25 DIAGNOSIS — R0683 Snoring: Secondary | ICD-10-CM | POA: Insufficient documentation

## 2016-02-25 DIAGNOSIS — E669 Obesity, unspecified: Secondary | ICD-10-CM

## 2016-02-25 DIAGNOSIS — Z716 Tobacco abuse counseling: Secondary | ICD-10-CM

## 2016-02-25 DIAGNOSIS — F17200 Nicotine dependence, unspecified, uncomplicated: Secondary | ICD-10-CM

## 2016-02-25 MED ORDER — FLUOXETINE HCL 20 MG PO TABS: ORAL_TABLET | ORAL | 1 refills | Status: DC

## 2016-02-25 NOTE — Assessment & Plan Note (Signed)
23 pk yr hx or more.  Pt not ready to quit.  Will readdress later date.

## 2016-02-25 NOTE — Assessment & Plan Note (Signed)
-   prozac started.  - Counseled patient on pathophysiology of disease and discussed various treatment options, which often includes dietary and lifestyle modifications as first line, in addition to discussing the risks and benefits of various medications.    - I also highly encouraged pt to seek a counselor for CBT.  - Anticipatory guidance given.   - Encouraged to return to clinic or call the office with any further questions or concerns.

## 2016-02-25 NOTE — Assessment & Plan Note (Signed)
Not at goal.   Keep eye on it at home.   Bring in log next OV.  Wt loss, diet-low salt and exercise encouraged.  SMoking cess.

## 2016-02-25 NOTE — Progress Notes (Signed)
New patient office visit note:  Impression and Recommendations:    1. Snoring   2. GAD (generalized anxiety disorder)   3. Obesity with serious comorbidity, unspecified classification, unspecified obesity type   4. OSA (obstructive sleep apnea)   5. Gastroesophageal reflux disease, esophagitis presence not specified   6. Tobacco use disorder   7. Tobacco abuse counseling   8. Morbid obesity (HCC)   9. Social anxiety disorder   10. Borderline hypertension     Tobacco abuse counseling Not ready to quit  Borderline hypertension Not at goal.   Keep eye on it at home.   Bring in log next OV.  Wt loss, diet-low salt and exercise encouraged.  SMoking cess.   Tobacco use disorder 23 pk yr hx or more.  Pt not ready to quit.  Will readdress later date.   Social anxiety disorder - prozac started.  - Counseled patient on pathophysiology of disease and discussed various treatment options, which often includes dietary and lifestyle modifications as first line, in addition to discussing the risks and benefits of various medications.    - I also highly encouraged pt to seek a counselor for CBT.  - Anticipatory guidance given.   - Encouraged to return to clinic or call the office with any further questions or concerns.  Morbid obesity helathy lifestyle changes encouraged.   Exercise, meditation, counseling etc.   OSA (obstructive sleep apnea) Referral sent to sleep medicine doc for evaluation.  Gastroesophageal reflux disease Cont meds     Orders Placed This Encounter  Procedures  . Ambulatory referral to Cardiology      New Prescriptions   FLUOXETINE (PROZAC) 20 MG TABLET    20mg  capsule daily    Modified Medications   No medications on file    Discontinued Medications   CYCLOBENZAPRINE (FLEXERIL) 10 MG TABLET    Take 1 tablet (10 mg total) by mouth 2 (two) times daily as needed for muscle spasms.   PAROXETINE (PAXIL) 20 MG TABLET    Take 1 tablet (20  mg total) by mouth daily.    Patient's Medications  New Prescriptions   FLUOXETINE (PROZAC) 20 MG TABLET    20mg  capsule daily  Previous Medications   ASPIRIN 325 MG TABLET    Take 650 mg by mouth every 4 (four) hours as needed for mild pain or headache.    OMEPRAZOLE (PRILOSEC OTC) 20 MG TABLET    Take 20 mg by mouth daily.  Modified Medications   No medications on file  Discontinued Medications   CYCLOBENZAPRINE (FLEXERIL) 10 MG TABLET    Take 1 tablet (10 mg total) by mouth 2 (two) times daily as needed for muscle spasms.   PAROXETINE (PAXIL) 20 MG TABLET    Take 1 tablet (20 mg total) by mouth daily.    Return in about 4 weeks (around 03/24/2016) for near futre- Fasting Bldwrk and then OV with me.  The patient was counseled, risk factors were discussed, anticipatory guidance given.  Gross side effects, risk and benefits, and alternatives of medications discussed with patient.  Patient is aware that all medications have potential side effects and we are unable to predict every side effect or drug-drug interaction that may occur.  Expresses verbal understanding and consents to current therapy plan and treatment regimen.  Please see AVS handed out to patient at the end of our visit for further patient instructions/ counseling done pertaining to today's office visit.  Note: This document was prepared using Dragon voice recognition software and may include unintentional dictation errors.  ----------------------------------------------------------------------------------------------------------------------    Subjective:    Chief Complaint  Patient presents with  . Establish Care    HPI: Barbara Romero is a pleasant 35 y.o. female [facility] to review their medical history with me and establish care.   I asked the patient to review their chronic problem list with me to ensure everything was updated and accurate.    1) -   Works from home.  Lives with her parents b/c she "doesn't want to live alone."   She brought her mom into OV with her today b/c she was 'too scared' to come alone. Terrible trouble with anxiety- social anxiety per her mother.  Pt doesn't like to leave the house.   Parents even have to do grocery shopping for her.   Pt is even worried that someone will break in at home, and pt is even too scared to drive.  Pt very tearful in the office today.   Also explains she feels difficult to motivate/ get out of bed etc.   Tried paxil  In past but read the s-e and so she never took it.    2) Also, per MOm- she stops breathing at night.  Breath holding etc.  Wakes up feeling tired.  Mom is worried she has OSA.  Never was tested.   STOP BANG: Snore:    Y  Tired:     y Observed stop breathing:  y Hypertension:   y  BMI >35:   y Age >50:   n Neck > 16 inches:  y Female gender:   n ------------------------------------------ Total:     6/8  3) Pt smokes---> since 35 yo or so- 1 ppd.  Not ready to quit but would like to in future at some point.  4) GERD:  Uses prilosec daily with decent control.     Patient Care Team    Relationship Specialty Notifications Start End  Thomasene Loteborah Norleen Xie, DO PCP - General Family Medicine  02/25/16      Wt Readings from Last 3 Encounters:  02/25/16 (!) 302 lb 4.8 oz (137.1 kg)  10/08/13 282 lb 9.6 oz (128.2 kg)   BP Readings from Last 3 Encounters:  02/25/16 (!) 134/92  01/14/16 150/96  10/08/13 128/88   Pulse Readings from Last 3 Encounters:  02/25/16 94  01/14/16 83  10/08/13 94   BMI Readings from Last 3 Encounters:  02/25/16 48.06 kg/m  10/08/13 44.93 kg/m   No results found for: HGBA1C  Patient Active Problem List   Diagnosis Date Noted  . GAD (generalized anxiety disorder) 02/25/2016  . Morbid obesity 02/25/2016  . OSA (obstructive sleep apnea) 02/25/2016  . Gastroesophageal reflux disease 02/25/2016  . Snoring 02/25/2016  . Tobacco use disorder  02/25/2016  . Tobacco abuse counseling 02/25/2016  . Social anxiety disorder 02/25/2016  . Borderline hypertension 02/25/2016     Past Medical History:  Diagnosis Date  . GERD (gastroesophageal reflux disease)      Past Surgical History:  Procedure Laterality Date  . BACK SURGERY     L5     Family History  Problem Relation Age of Onset  . Cancer Father     prostate  . Hypertension Father      History  Drug Use No    History  Alcohol Use  . Yes  Comment: social    History  Smoking Status  . Current Every Day Smoker  . Packs/day: 1.00  . Years: 23.00  . Types: Cigarettes  Smokeless Tobacco  . Never Used    Patient's Medications  New Prescriptions   FLUOXETINE (PROZAC) 20 MG TABLET    20mg  capsule daily  Previous Medications   ASPIRIN 325 MG TABLET    Take 650 mg by mouth every 4 (four) hours as needed for mild pain or headache.    OMEPRAZOLE (PRILOSEC OTC) 20 MG TABLET    Take 20 mg by mouth daily.  Modified Medications   No medications on file  Discontinued Medications   CYCLOBENZAPRINE (FLEXERIL) 10 MG TABLET    Take 1 tablet (10 mg total) by mouth 2 (two) times daily as needed for muscle spasms.   PAROXETINE (PAXIL) 20 MG TABLET    Take 1 tablet (20 mg total) by mouth daily.    Allergies: Hydrocodone  Review of Systems  Constitutional: Negative.  Negative for chills, diaphoresis, fever, malaise/fatigue and weight loss.  HENT: Negative.  Negative for congestion, sore throat and tinnitus.   Eyes: Negative.  Negative for blurred vision, double vision and photophobia.  Respiratory: Negative.  Negative for cough and wheezing.   Cardiovascular: Negative.  Negative for chest pain and palpitations.  Gastrointestinal: Positive for heartburn. Negative for blood in stool, diarrhea, nausea and vomiting.  Genitourinary: Negative.  Negative for dysuria, frequency and urgency.  Musculoskeletal: Negative.  Negative for joint pain and myalgias.  Skin:  Negative.  Negative for itching and rash.  Neurological: Negative.  Negative for dizziness, focal weakness, weakness and headaches.  Endo/Heme/Allergies: Negative.  Negative for environmental allergies and polydipsia. Does not bruise/bleed easily.  Psychiatric/Behavioral: Negative for depression, hallucinations, memory loss, substance abuse and suicidal ideas. The patient is nervous/anxious. The patient does not have insomnia.      Objective:   Blood pressure (!) 134/92, pulse 94, height 5' 6.5" (1.689 m), weight (!) 302 lb 4.8 oz (137.1 kg), last menstrual period 02/15/2016. Body mass index is 48.06 kg/m. General: Well Developed, well nourished, and in no acute distress.  Neuro: Alert and oriented x3, extra-ocular muscles intact, sensation grossly intact.  HEENT: Normocephalic, atraumatic, pupils equal round reactive to light, neck supple Skin: no gross suspicious lesions or rashes  Cardiac: Regular rate and rhythm, no murmurs rubs or gallops.  Respiratory: Essentially clear to auscultation bilaterally. Not using accessory muscles, speaking in full sentences.  Abdominal: Soft, not grossly distended Musculoskeletal: Ambulates w/o diff, FROM * 4 ext.  Vasc: less 2 sec cap RF, warm and pink  Psych:  No HI/SI, judgement and insight good, Tearful, anxious mood. Irritable Affect.

## 2016-02-25 NOTE — Patient Instructions (Signed)
Adjustment Disorder Adjustment disorder is an unusually severe reaction to a stressful life event, such as the loss of a job or physical illness. The event may be any stressful event other than the loss of a loved one. Adjustment disorder may affect your feelings, your thinking, how you act, or a combination of these. It may interfere with personal relationships or with the way you are at work, school, or home. People with this disorder are at risk for suicide and substance abuse. They may develop a more serious mental disorder, such as major depressive disorder or post-traumatic stress disorder. SIGNS AND SYMPTOMS  Symptoms may include:  Sadness, depressed mood, or crying spells.  Loss of enjoyment.  Change in appetite or weight.  Sense of loss or hopelessness.  Thoughts of suicide.  Anxiety, worry, or nervousness.  Trouble sleeping.  Avoiding family and friends.  Poor school performance.  Fighting or vandalism.  Reckless driving.  Skipping school.  Poor work International aid/development workerperformance.  Ignoring bills. Symptoms of adjustment disorder start within 3 months of the stressful life event. They do not last more than 6 months after the event has ended. DIAGNOSIS  To make a diagnosis, your health care provider will ask about what has happened in your life and how it has affected you. He or she may also ask about your medical history and use of medicines, alcohol, and other substances. Your health care provider may do a physical exam and order lab tests or other studies. You may be referred to a mental health specialist for evaluation. TREATMENT  Treatment options include:  Counseling or talk therapy. Talk therapy is usually provided by mental health specialists.  Medicine. Certain medicines may help with depression, anxiety, and sleep.  Support groups. Support groups offer emotional support, advice, and guidance. They are made up of people who have had similar experiences. HOME CARE  INSTRUCTIONS  Keep all follow-up visits as directed by your health care provider. This is important.  Take medicines only as directed by your health care provider. SEEK MEDICAL CARE IF:  Your symptoms get worse.  SEEK IMMEDIATE MEDICAL CARE IF: You have serious thoughts about hurting yourself or someone else. MAKE SURE YOU:  Understand these instructions.  Will watch your condition.  Will get help right away if you are not doing well or get worse.   This information is not intended to replace advice given to you by your health care provider. Make sure you discuss any questions you have with your health care provider.   Document Released: 12/06/2005 Document Revised: 04/24/2014 Document Reviewed: 08/26/2013 Elsevier Interactive Patient Education 2016 Elsevier Inc. Panic Attacks Panic attacks are sudden, short-livedsurges of severe anxiety, fear, or discomfort. They may occur for no reason when you are relaxed, when you are anxious, or when you are sleeping. Panic attacks may occur for a number of reasons:   Healthy people occasionally have panic attacks in extreme, life-threatening situations, such as war or natural disasters. Normal anxiety is a protective mechanism of the body that helps us react to danger (fight or flight response).  Panic attacks are often seen with anxiety disorders, such as panic disorder, social anxiety disorder, generalized anxiety disorder, and phobias. Anxiety disorders cause excessive or uncontrollable anxiety. They may interfere with your relationships or other life activities.  Panic attacks are sometimes seen with other mental illnesses, such as depression and posttraumatic stress disorder.  Certain medical conditions, prescription medicines, and drugs of abuse can cause panic attacks. SYMPTOMS  Panic  attacks start suddenly, peak within 20 minutes, and are accompanied by four or more of the following symptoms:  Pounding heart or fast heart rate  (palpitations).  Sweating.  Trembling or shaking.  Shortness of breath or feeling smothered.  Feeling choked.  Chest pain or discomfort.  Nausea or strange feeling in your stomach.  Dizziness, light-headedness, or feeling like you will faint.  Chills or hot flushes.  Numbness or tingling in your lips or hands and feet.  Feeling that things are not real or feeling that you are not yourself.  Fear of losing control or going crazy.  Fear of dying. Some of these symptoms can mimic serious medical conditions. For example, you may think you are having a heart attack. Although panic attacks can be very scary, they are not life threatening. DIAGNOSIS  Panic attacks are diagnosed through an assessment by your health care provider. Your health care provider will ask questions about your symptoms, such as where and when they occurred. Your health care provider will also ask about your medical history and use of alcohol and drugs, including prescription medicines. Your health care provider may order blood tests or other studies to rule out a serious medical condition. Your health care provider may refer you to a mental health professional for further evaluation. TREATMENT   Most healthy people who have one or two panic attacks in an extreme, life-threatening situation will not require treatment.  The treatment for panic attacks associated with anxiety disorders or other mental illness typically involves counseling with a mental health professional, medicine, or a combination of both. Your health care provider will help determine what treatment is best for you.  Panic attacks due to physical illness usually go away with treatment of the illness. If prescription medicine is causing panic attacks, talk with your health care provider about stopping the medicine, decreasing the dose, or substituting another medicine.  Panic attacks due to alcohol or drug abuse go away with abstinence. Some adults  need professional help in order to stop drinking or using drugs. HOME CARE INSTRUCTIONS   Take all medicines as directed by your health care provider.   Schedule and attend follow-up visits as directed by your health care provider. It is important to keep all your appointments. SEEK MEDICAL CARE IF:  You are not able to take your medicines as prescribed.  Your symptoms do not improve or get worse. SEEK IMMEDIATE MEDICAL CARE IF:   You experience panic attack symptoms that are different than your usual symptoms.  You have serious thoughts about hurting yourself or others.  You are taking medicine for panic attacks and have a serious side effect. MAKE SURE YOU:  Understand these instructions.  Will watch your condition.  Will get help right away if you are not doing well or get worse.   This information is not intended to replace advice given to you by your health care provider. Make sure you discuss any questions you have with your health care provider.   Document Released: 04/03/2005 Document Revised: 04/08/2013 Document Reviewed: 11/15/2012 Elsevier Interactive Patient Education [employer]2016 Elsevier Inc.

## 2016-02-25 NOTE — Assessment & Plan Note (Signed)
Cont meds   

## 2016-02-25 NOTE — Assessment & Plan Note (Signed)
Referral sent to sleep medicine doc for evaluation.

## 2016-02-25 NOTE — Assessment & Plan Note (Signed)
Not ready to quit.  

## 2016-02-25 NOTE — Assessment & Plan Note (Signed)
helathy lifestyle changes encouraged.   Exercise, meditation, counseling etc.

## 2016-03-17 ENCOUNTER — Other Ambulatory Visit (INDEPENDENT_AMBULATORY_CARE_PROVIDER_SITE_OTHER): Payer: BLUE CROSS/BLUE SHIELD

## 2016-03-17 DIAGNOSIS — G4733 Obstructive sleep apnea (adult) (pediatric): Secondary | ICD-10-CM

## 2016-03-17 DIAGNOSIS — R030 Elevated blood-pressure reading, without diagnosis of hypertension: Secondary | ICD-10-CM

## 2016-03-17 DIAGNOSIS — K219 Gastro-esophageal reflux disease without esophagitis: Secondary | ICD-10-CM

## 2016-03-17 DIAGNOSIS — F17200 Nicotine dependence, unspecified, uncomplicated: Secondary | ICD-10-CM | POA: Diagnosis not present

## 2016-03-17 DIAGNOSIS — F411 Generalized anxiety disorder: Secondary | ICD-10-CM | POA: Diagnosis not present

## 2016-03-17 DIAGNOSIS — F4010 Social phobia, unspecified: Secondary | ICD-10-CM

## 2016-03-17 LAB — CBC WITH DIFFERENTIAL/PLATELET
BASOS ABS: 89 {cells}/uL (ref 0–200)
BASOS PCT: 1 %
EOS PCT: 3 %
Eosinophils Absolute: 267 cells/uL (ref 15–500)
HCT: 47.9 % — ABNORMAL HIGH (ref 35.0–45.0)
Hemoglobin: 15.8 g/dL — ABNORMAL HIGH (ref 11.7–15.5)
LYMPHS PCT: 33 %
Lymphs Abs: 2937 cells/uL (ref 850–3900)
MCH: 29.8 pg (ref 27.0–33.0)
MCHC: 33 g/dL (ref 32.0–36.0)
MCV: 90.4 fL (ref 80.0–100.0)
MONOS PCT: 8 %
MPV: 10.9 fL (ref 7.5–12.5)
Monocytes Absolute: 712 cells/uL (ref 200–950)
NEUTROS ABS: 4895 {cells}/uL (ref 1500–7800)
Neutrophils Relative %: 55 %
PLATELETS: 245 10*3/uL (ref 140–400)
RBC: 5.3 MIL/uL — ABNORMAL HIGH (ref 3.80–5.10)
RDW: 13.7 % (ref 11.0–15.0)
WBC: 8.9 10*3/uL (ref 3.8–10.8)

## 2016-03-18 LAB — LIPID PANEL
CHOL/HDL RATIO: 4.2 ratio (ref <5.0)
CHOLESTEROL: 185 mg/dL (ref <200)
HDL: 44 mg/dL — AB (ref >50)
LDL Cholesterol: 123 mg/dL — ABNORMAL HIGH (ref <100)
TRIGLYCERIDES: 89 mg/dL (ref <150)
VLDL: 18 mg/dL (ref <30)

## 2016-03-18 LAB — COMPLETE METABOLIC PANEL WITH GFR
ALT: 14 U/L (ref 6–29)
AST: 14 U/L (ref 10–30)
Albumin: 4.1 g/dL (ref 3.6–5.1)
Alkaline Phosphatase: 59 U/L (ref 33–115)
BILIRUBIN TOTAL: 0.7 mg/dL (ref 0.2–1.2)
BUN: 12 mg/dL (ref 7–25)
CHLORIDE: 105 mmol/L (ref 98–110)
CO2: 26 mmol/L (ref 20–31)
Calcium: 9.6 mg/dL (ref 8.6–10.2)
Creat: 0.77 mg/dL (ref 0.50–1.10)
GLUCOSE: 95 mg/dL (ref 65–99)
Potassium: 5 mmol/L (ref 3.5–5.3)
SODIUM: 139 mmol/L (ref 135–146)
TOTAL PROTEIN: 7 g/dL (ref 6.1–8.1)

## 2016-03-18 LAB — TSH: TSH: 1.17 m[IU]/L

## 2016-03-18 LAB — T4, FREE: Free T4: 1.2 ng/dL (ref 0.8–1.8)

## 2016-03-18 LAB — HEMOGLOBIN A1C
Hgb A1c MFr Bld: 5.1 % (ref <5.7)
MEAN PLASMA GLUCOSE: 100 mg/dL

## 2016-03-18 LAB — VITAMIN D 25 HYDROXY (VIT D DEFICIENCY, FRACTURES): Vit D, 25-Hydroxy: 26 ng/mL — ABNORMAL LOW (ref 30–100)

## 2016-03-24 ENCOUNTER — Ambulatory Visit: Payer: BLUE CROSS/BLUE SHIELD | Admitting: Family Medicine

## 2016-03-31 ENCOUNTER — Ambulatory Visit (INDEPENDENT_AMBULATORY_CARE_PROVIDER_SITE_OTHER): Payer: BLUE CROSS/BLUE SHIELD | Admitting: Cardiovascular Disease

## 2016-03-31 ENCOUNTER — Encounter: Payer: Self-pay | Admitting: Cardiovascular Disease

## 2016-03-31 ENCOUNTER — Other Ambulatory Visit: Payer: Self-pay | Admitting: Cardiovascular Disease

## 2016-03-31 VITALS — BP 178/113 | HR 109 | Ht 66.5 in | Wt 299.0 lb

## 2016-03-31 DIAGNOSIS — I10 Essential (primary) hypertension: Secondary | ICD-10-CM

## 2016-03-31 DIAGNOSIS — R0683 Snoring: Secondary | ICD-10-CM | POA: Diagnosis not present

## 2016-03-31 DIAGNOSIS — R600 Localized edema: Secondary | ICD-10-CM

## 2016-03-31 DIAGNOSIS — G4733 Obstructive sleep apnea (adult) (pediatric): Secondary | ICD-10-CM

## 2016-03-31 DIAGNOSIS — Z720 Tobacco use: Secondary | ICD-10-CM

## 2016-03-31 MED ORDER — VALSARTAN 160 MG PO TABS: 160.0000 mg | ORAL_TABLET | Freq: Every day | ORAL | 3 refills | Status: DC

## 2016-03-31 NOTE — Patient Instructions (Signed)
Medication Instructions:  Start new prescription for Valsartan 160 mg. [facility]   Labwork:  2 weeks after starting new medication. Appointment not needed.  Testing/Procedures:Your physician has requested that you have an echocardiogram. Echocardiography is a painless test that uses sound waves to create images of your heart. It provides your doctor with information about the size and shape of your heart and how well your heart's chambers and valves are working. This procedure takes approximately one hour. There are no restrictions for this procedure  ECHO- this will be done at the [address] location.  Your physician has recommended that you have a sleep study. This test records several body functions during sleep, including: brain activity, eye movement, oxygen and carbon dioxide blood levels, heart rate and rhythm, breathing rate and rhythm, the flow of air through your mouth and nose, snoring, body muscle movements, and chest and belly movement.  [facility].     Follow-Up:  3 months with Dr Tresa EndoKelly  Any Other Special Instructions Will Be Listed Below (If Applicable).

## 2016-04-01 NOTE — Progress Notes (Signed)
Cardiology Office Note    Date:  04/01/2016   ID:  Barbara Romero, DOB 01-01-1981, MRN [id number]  PCP:  Barbara Dance, DO  Cardiologist:  Barbara Majestic, MD   New cardiology/sleep evaluation.  History of Present Illness:  Barbara Romero is a 35 y.o. female who is referred through the courtesy of Dr. Mellody Romero for sleep evaluation and hypertension.  Barbara Romero has a long-standing history of morbid obesity, and while she was living in [place], [place] in 2002 was scheduled to undergo a sleep study.  However, on the night of her planned sleep study, it had snowed in her study was canceled.  She never followed up with this and never had subsequent evaluation.  She now lives in the [place] area at home with her Barbara Romero.  Over the past 15 years.  She is continued to have difficulty with sleep.  Recently, she has been noted to have very loud snoring as well as witnessed apnea.  She often wakes up 3-4 times per night and has frequent nocturia.  Typically she goes to bed between 1 AM and 2 AM and wakes up at 9 AM.  Her sleep is nonrestorative.  Over the last several years.  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  She does not exercise.  She has been smoking at least one pack per day for 22 years.  She has noticed progressive weight gain.  She was recently evaluated by Dr Barbara Romero and was felt to have a generalized anxiety disorder and was given a prescription for Prozac.  During her evaluation, she had diastolic hypertension with a blood pressure of 134/92.  At that time she weighed 302 pounds and BMI was 48.06 kg/m.  She is now referred for evaluation.  During her initial evaluation, her Bang score was 6/8.  An Epworth Sleepiness Scale score was calculated in the office today and this endorsed that 6 with a moderate chance of dozing while sitting and reading, a high chance of dozing while lying down to  rest in the afternoon when circumstances permit, and a slight chance of dozing while watching television.   Past Medical History:  Diagnosis Date  . GERD (gastroesophageal reflux disease)     Past Surgical History:  Procedure Laterality Date  . BACK SURGERY     L5    Current Medications: Outpatient Medications Prior to Visit  Medication Sig Dispense Refill  . aspirin 325 MG tablet Take 650 mg by mouth every 4 (four) hours as needed for mild pain or headache.     Marland Kitchen FLUoxetine (PROZAC) 20 MG tablet 27m capsule daily 90 tablet 1  . omeprazole (PRILOSEC OTC) 20 MG tablet Take 20 mg by mouth daily.     No facility-administered medications prior to visit.      Allergies:   Hydrocodone   Social History   Social History  . Marital status: Single    Spouse name: N/A  . Number of children: N/A  . Years of education: N/A   Social History Main Topics  . Smoking status: Current Every Day Smoker    Packs/day: 1.00    Years: 23.00    Types: Cigarettes  . Smokeless tobacco: Never Used  . Alcohol use Yes     Comment: social  . Drug use: No  . Sexual activity: Not Currently    Birth control/ protection: None   Other  Topics Concern  . None   Social History Narrative  . None     Family History:  The patient's  family history includes Cancer in her father; Hypertension in her father. .  Her mother is alive at 71.  Her father is 53.  She had one sister who died in a car accident at age 39.   ROS General: Negative; No fevers, chills, or night sweats; positive for morbid obesity HEENT: Negative; No changes in vision or hearing, sinus congestion, difficulty swallowing Pulmonary: Negative; No cough, wheezing, shortness of breath, hemoptysis Cardiovascular: Negative; No chest pain, presyncope, syncope, palpitations GI: Negative; No nausea, vomiting, diarrhea, or abdominal pain GU: Negative; No dysuria, hematuria, or difficulty voiding Musculoskeletal: Negative; no myalgias, joint  pain, or weakness Hematologic/Oncology: Negative; no easy bruising, bleeding Endocrine: Negative; no heat/cold intolerance; no diabetes Neuro: Negative; no changes in balance, headaches Skin: Negative; No rashes or skin lesions Psychiatric: Negative; No behavioral problems, depression Sleep: Positive for snoring, witnessed apnea, nonrestorative sleep, and occasional daytime sleepiness; no bruxism, restless legs, hypnogognic hallucinations, no cataplexy Other comprehensive 14 point system review is negative.   PHYSICAL EXAM:   VS:  BP (!) 178/113   Pulse (!) 109   Ht 5' 6.5" (1.689 m)   Wt 299 lb (135.6 kg)   BMI 47.54 kg/m     Repeat blood pressure by me was 160/100.  Wt Readings from Last 3 Encounters:  03/31/16 299 lb (135.6 kg)  02/25/16 (!) 302 lb 4.8 oz (137.1 kg)  10/08/13 282 lb 9.6 oz (128.2 kg)    General: Alert, oriented, no distress. Morbidly obese Skin: normal turgor, no rashes, warm and dry HEENT: Normocephalic, atraumatic. Pupils equal round and reactive to light; sclera anicteric; extraocular muscles intact; Fundi disks flat; no hemorrhages or exudates Nose without nasal septal hypertrophy Mouth/Parynx benign; Mallinpatti scale 4 Neck: No JVD, no carotid bruits; normal carotid upstroke; Thick neck Lungs: clear to ausculatation and percussion; no wheezing or rales Chest wall: without tenderness to palpitation Heart: PMI not displaced, RRR, s1 s2 normal, 1/6 systolic murmur, no diastolic murmur, no rubs, gallops, thrills, or heaves Abdomen: soft, nontender; no hepatosplenomehaly, BS+; abdominal aorta nontender and not dilated by palpation. Back: no CVA tenderness Pulses 2+ Musculoskeletal: full range of motion, normal strength, no joint deformities Extremities: no clubbing cyanosis or edema, Homan's sign negative  Neurologic: grossly nonfocal; Cranial nerves grossly wnl Psychologic: Normal mood and affect   Studies/Labs Reviewed:   ECG (independently read by  me): Sinus tachycardia 101 bpm.  Possible underlying right atrial enlargement  Recent Labs: BMP Latest Ref Rng & Units 03/17/2016 01/14/2016 06/29/2013  Glucose 65 - 99 mg/dL 95 101(H) 127(H)  BUN 7 - 25 mg/dL '12 18 13  ' Creatinine 0.50 - 1.10 mg/dL 0.77 0.84 1.12(H)  Sodium 135 - 146 mmol/L 139 137 137  Potassium 3.5 - 5.3 mmol/L 5.0 4.1 4.4  Chloride 98 - 110 mmol/L 105 107 101  CO2 20 - 31 mmol/L 26 21(L) 22  Calcium 8.6 - 10.2 mg/dL 9.6 9.3 9.2     Hepatic Function Latest Ref Rng & Units 03/17/2016 01/14/2016 06/29/2013  Total Protein 6.1 - 8.1 g/dL 7.0 8.3(H) 7.1  Albumin 3.6 - 5.1 g/dL 4.1 4.5 3.6  AST 10 - 30 U/L '14 17 19  ' ALT 6 - 29 U/L '14 16 21  ' Alk Phosphatase 33 - 115 U/L 59 60 68  Total Bilirubin 0.2 - 1.2 mg/dL 0.7 0.7 <0.2(L)  Bilirubin, Direct - - - -  CBC Latest Ref Rng & Units 03/17/2016 01/14/2016 06/29/2013  WBC 3.8 - 10.8 K/uL 8.9 11.2(H) 10.4  Hemoglobin 11.7 - 15.5 g/dL 15.8(H) 16.1(H) 14.4  Hematocrit 35.0 - 45.0 % 47.9(H) 47.8(H) 41.7  Platelets 140 - 400 K/uL 245 255 265   Lab Results  Component Value Date   MCV 90.4 03/17/2016   MCV 89.0 01/14/2016   MCV 89.3 06/29/2013   Lab Results  Component Value Date   TSH 1.17 03/17/2016   Lab Results  Component Value Date   HGBA1C 5.1 03/17/2016     BNP No results found for: BNP  ProBNP No results found for: PROBNP   Lipid Panel     Component Value Date/Time   CHOL 185 03/17/2016 0856   TRIG 89 03/17/2016 0856   HDL 44 (L) 03/17/2016 0856   CHOLHDL 4.2 03/17/2016 0856   VLDL 18 03/17/2016 0856   LDLCALC 123 (H) 03/17/2016 0856     RADIOLOGY: No results found.   Additional studies/ records that were reviewed today include:  I reviewed the recent evaluation from Dr. Raliegh Romero    ASSESSMENT:    1. OSA (obstructive sleep apnea)   2. Snoring   3. Morbid obesity   4. Essential hypertension, benign   5. Accelerated hypertension   6. Tobacco use      PLAN:  Ms. Braelynn Benning is a  35 year old female who has a long-standing history of tobacco use for over 22 years, as well as symptom complex, highly suggestive of obstructive sleep apnea for approximally 15 years.  She has a history of morbid obesity and her BMI today is 47.54.  She has stage II hypertension and is not on any antihypertensive medications.  I am initiating therapy with valsartan 160 mg for angiotensin receptor blockade therapy, which should be well-tolerated.  I reviewed blood work which she had from her primary physician.  I'm scheduling her for 2-D echo Doppler study to evaluate for hypertensive heart disease and particularly looking for systolic and diastolic function as well as with a history of probable sleep apnea to evaluate for underlying pulmonary hypertension.  She has a symptom complex, very worrisome for obstructive sleep apnea with loud snoring, witnessed apnea, nonrestorative sleep, frequent awakenings, nocturia, and morning headaches.  She will be scheduled for a split-night study for further evaluation at the [facility] lab.  I had a long discussion with her concerning the importance of exercise and weight loss.  I had a lengthy discussion with her and reviewed data regarding untreated sleep apnea and potential risks for cardiovascular disease.  We discussed the importance of smoking cessation.  We discussed the importance of exercising at least 5 days per week for 30 minutes at a time.  She will monitor her blood pressure.  A follow-up be met will be obtained in 2 weeks after initiation of ARB therapy.  If her blood pressure is not well-controlled with single therapy dose tried titration or additional treatment will be indicated.  I discussed with her the new hypertensive guidelines.  I will see her back in the office in 2-3 months for reevaluation.   Medication Adjustments/Labs and Tests Ordered: Current medicines are reviewed at length with the patient today.  Concerns regarding medicines are  outlined above.  Medication changes, Labs and Tests ordered today are listed in the Patient Instructions below.  Patient Instructions  Medication Instructions:  Start new prescription for Valsartan 160 mg. [facility]  Labwork:  2 weeks after starting new medication. Appointment not needed.  Testing/Procedures:Your physician has requested that you have an echocardiogram. Echocardiography is a painless test that uses sound waves to create images of your heart. It provides your doctor with information about the size and shape of your heart and how well your heart's chambers and valves are working. This procedure takes approximately one hour. There are no restrictions for this procedure  ECHO- this will be done at the [address] location.  Your physician has recommended that you have a sleep study. This test records several body functions during sleep, including: brain activity, eye movement, oxygen and carbon dioxide blood levels, heart rate and rhythm, breathing rate and rhythm, the flow of air through your mouth and nose, snoring, body muscle movements, and chest and belly movement.  This will be done at [facility].     Follow-Up:  3 months with Dr Claiborne Billings  Any Other Special Instructions Will Be Listed Below (If Applicable).      Signed, Barbara Majestic, MD  04/01/2016 12:13 PM    [facility] [address], [place], [place], [place]  [address] Phone: [phone]

## 2016-04-04 ENCOUNTER — Other Ambulatory Visit: Payer: Self-pay

## 2016-04-04 ENCOUNTER — Encounter: Payer: Self-pay | Admitting: Cardiovascular Disease

## 2016-04-04 ENCOUNTER — Ambulatory Visit (INDEPENDENT_AMBULATORY_CARE_PROVIDER_SITE_OTHER): Payer: BLUE CROSS/BLUE SHIELD

## 2016-04-04 ENCOUNTER — Other Ambulatory Visit: Payer: Self-pay | Admitting: Cardiovascular Disease

## 2016-04-04 ENCOUNTER — Ambulatory Visit (HOSPITAL_BASED_OUTPATIENT_CLINIC_OR_DEPARTMENT_OTHER): Payer: BLUE CROSS/BLUE SHIELD | Attending: Cardiovascular Disease | Admitting: Cardiovascular Disease

## 2016-04-04 DIAGNOSIS — R011 Cardiac murmur, unspecified: Secondary | ICD-10-CM

## 2016-04-04 DIAGNOSIS — E669 Obesity, unspecified: Secondary | ICD-10-CM | POA: Insufficient documentation

## 2016-04-04 DIAGNOSIS — R0683 Snoring: Secondary | ICD-10-CM | POA: Insufficient documentation

## 2016-04-04 DIAGNOSIS — G4733 Obstructive sleep apnea (adult) (pediatric): Secondary | ICD-10-CM

## 2016-04-04 DIAGNOSIS — R5383 Other fatigue: Secondary | ICD-10-CM | POA: Diagnosis not present

## 2016-04-04 DIAGNOSIS — I10 Essential (primary) hypertension: Secondary | ICD-10-CM

## 2016-04-04 DIAGNOSIS — R600 Localized edema: Secondary | ICD-10-CM | POA: Diagnosis not present

## 2016-04-04 NOTE — Progress Notes (Unsigned)
n

## 2016-04-05 ENCOUNTER — Encounter: Payer: Self-pay | Admitting: Cardiovascular Disease

## 2016-04-07 ENCOUNTER — Telehealth: Payer: Self-pay | Admitting: Cardiovascular Disease

## 2016-04-07 NOTE — Telephone Encounter (Signed)
Spoke with pt she states that she has this "feeling" in her chest for the last 2 nights-that she can't explain what it is. She states that it's like anxiety and pressure in her chest it lasted about 30 minutes and it happened while sitting and using her IPAD. She states that this happened today but it felt different like fluttering in her chest she states that she took 2 325 mg ASA and it lasted about 1 hour. Pt denies any other sx no Chest pain, SOB or dizziness. Pt states that she has changed her diet and has stopped taking Omeprazole and has cut down her Prozac to 10mg  and has been takin her diovan 160mg  for 1 week. Pt has no BP readings, please advise.

## 2016-04-07 NOTE — Telephone Encounter (Signed)
The patient should go to the ED with recurrent symptoms.  Apparently she made an office appt for 12/29.  It is noted that she could not come any earlier.

## 2016-04-07 NOTE — Telephone Encounter (Signed)
Called pt back and scheduled an appt for 04-14-16 pt states that this is the soonest she can come in. Any other recommendations?

## 2016-04-07 NOTE — Telephone Encounter (Signed)
Patient c/o Palpitations:  High priority if patient c/o lightheadedness and shortness of breath.  1. How long have you been having palpitations? 04/05/16 2. Are you currently experiencing lightheadedness and shortness of breath? no 3. Have you checked your BP and heart rate? (document readings) no  4. Are you experiencing any other symptoms? Left side

## 2016-04-07 NOTE — Telephone Encounter (Signed)
Returning your call. °

## 2016-04-07 NOTE — Telephone Encounter (Signed)
lmtcb

## 2016-04-07 NOTE — Telephone Encounter (Signed)
Pt informed to go to the ER if symptoms recur, worsen, or any new symptoms develop verbalizes understanding

## 2016-04-14 ENCOUNTER — Ambulatory Visit: Payer: BLUE CROSS/BLUE SHIELD | Admitting: Student

## 2016-04-14 DIAGNOSIS — I10 Essential (primary) hypertension: Secondary | ICD-10-CM | POA: Diagnosis not present

## 2016-04-14 LAB — BASIC METABOLIC PANEL
BUN: 14 mg/dL (ref 7–25)
CHLORIDE: 103 mmol/L (ref 98–110)
CO2: 25 mmol/L (ref 20–31)
CREATININE: 0.85 mg/dL (ref 0.50–1.10)
Calcium: 9.7 mg/dL (ref 8.6–10.2)
Glucose, Bld: 94 mg/dL (ref 65–99)
Potassium: 4.7 mmol/L (ref 3.5–5.3)
Sodium: 137 mmol/L (ref 135–146)

## 2016-04-16 NOTE — Procedures (Signed)
Patient Name: Barbara Romero, [place] Study Date: 04/04/2016 Gender: Female D.O.B: 28-Dec-1980 Age (years): 35 Referring Provider: Nicki Guadalajarahomas Patty Leitzke MD, ABSM Height (inches): 67 Interpreting Physician: Nicki Guadalajarahomas Malisa Ruggiero MD, ABSM Weight (lbs): 293 RPSGT: Shelah LewandowskyGregory, [name] BMI: 47 MRN: [id number] Neck Size: 15.50  CLINICAL INFORMATION Sleep Study Type: NPSG  Indication for sleep study: Fatigue, Hypertension, Obesity, OSA, Sleep walking/talking/parasomnias, Snoring, Witnessed Apneas  Epworth Sleepiness Score: 6  SLEEP STUDY TECHNIQUE As per the AASM Manual for the Scoring of Sleep and Associated Events v2.3 (April 2016) with a hypopnea requiring 4% desaturations.  The channels recorded and monitored were frontal, central and occipital EEG, electrooculogram (EOG), submentalis EMG (chin), nasal and oral airflow, thoracic and abdominal wall motion, anterior tibialis EMG, snore microphone, electrocardiogram, and pulse oximetry.  MEDICATIONS aspirin 325 MG tablet FLUoxetine (PROZAC) 20 MG tablet omeprazole (PRILOSEC OTC) 20 MG tablet valsartan (DIOVAN) 160 MG tablet  Medications self-administered by patient taken the night of the study : ASPIRIN  SLEEP ARCHITECTURE The study was initiated at 9:42:06 PM and ended at 4:00:16 AM.  Sleep onset time was 18.0 minutes and the sleep efficiency was 38.9%. The total sleep time was 147.0 minutes.  Stage REM latency was N/A minutes.  The patient spent 47.96% of the night in stage N1 sleep, 52.04% in stage N2 sleep, 0.00% in stage N3 and 0.00% in REM.  Alpha intrusion was absent.  Supine sleep was 40.14%.  RESPIRATORY PARAMETERS The overall apnea/hypopnea index (AHI) was 105.7 per hour. There were 178 total apneas, including 170 obstructive, 5 central and 3 mixed apneas. There were 81 hypopneas and 11 RERAs.  The AHI during Stage REM sleep was N/A per hour.  AHI while supine was 119.0 per hour.  The mean oxygen saturation was 93.91%. The minimum  SpO2 during sleep was 84.00%.  Loud snoring was noted during this study.  CARDIAC DATA The 2 lead EKG demonstrated sinus rhythm. The mean heart rate was 81.36 beats per minute. Other EKG findings include: None.  LEG MOVEMENT DATA The total PLMS were 1 with a resulting PLMS index of 0.41. Associated arousal with leg movement index was 0.0 .  IMPRESSIONS - Severe obstructive sleep apnea with an AHI of 105.7/h. - No significant central sleep apnea occurred during this study (CAI = 2.0/h). - Moderatedoxygen desaturation to a nadir of 84.00%. - The patient snored with Loud snoring volume. - Abnormal sleep architecture with absence of slow-wave and REM sleep. - Reduced sleep efficiency. - An attempt was made to initiate CPAP titration, but the patient became very anxious and did not initially tolerate the mask, and the PSG study was resumed. - No cardiac abnormalities were noted during this study. - Clinically significant periodic limb movements did not occur during sleep. No significant associated arousals.  DIAGNOSIS - Obstructive Sleep Apnea (327.23 [G47.33 ICD-10]) - Nocturnal Hypoxemia (327.26 [G47.36 ICD-10])  RECOMMENDATIONS - Recommned therapeutic CPAP titration to determine optimal pressure required to alleviate the patient's severe sleep disordered breathing. Consider CPAP desensitization or a PAP NAP evaluation and if patient cannot tolerate a face mask, consider initiation of CPAP titration study with a nasal mask. Consider a sleep aid or anti-anxiolytic for the CPAP titration study. - Efforts should be made to optimize nasal and oral pharyngeal patency - Positional therapy avoiding supine position during sleep. - Avoid alcohol, sedatives and other CNS depressants that may worsen sleep apnea and disrupt normal sleep architecture. - Sleep hygiene should be reviewed to assess factors that may improve sleep quality. -  Weight management (BMI 47) and regular exercise should be  initiated or continued if appropriate.  [Electronically signed] 04/16/2016 10:48 AM  Nicki Guadalajarahomas Alicia Ackert MD, [facility]. ThomasFACC, ABSM Diplomate, American Board of Sleep Medicine   NPI: [id number] [facility] PH: [phone]   FX: [phone]) [phone] ACCREDITED BY THE AMERICAN ACADEMY OF SLEEP MEDICINE

## 2016-04-28 ENCOUNTER — Encounter: Payer: Self-pay | Admitting: Cardiovascular Disease

## 2016-05-04 ENCOUNTER — Encounter (HOSPITAL_BASED_OUTPATIENT_CLINIC_OR_DEPARTMENT_OTHER): Payer: BLUE CROSS/BLUE SHIELD

## 2016-05-05 ENCOUNTER — Telehealth: Payer: Self-pay | Admitting: *Deleted

## 2016-05-05 NOTE — Telephone Encounter (Signed)
-----   Message from Lennette Biharihomas A Kelly, MD sent at 04/16/2016 10:55 AM EST ----- Burna MortimerWanda .  Please schedule for a year.  Sleep apnea.  When they tried making her initial study, a split-night she became very anxious and did not tolerate the short trial of CPAP.  I would recommend that she take a sleep aid or anti-anxiolytic for her CPAP Titration trial.  If she feels she will not be able to tolerate the CPAP, then consider referring her to the DME company for Desensitization prior to her CPAP titration or she may need a Pap Nap evaluation at the sleep lab prior to her CPAP.

## 2016-05-05 NOTE — Telephone Encounter (Signed)
Spoke with patient informing her of her sleep study results and recommendations. Patient voiced verbal understanding. She does not feel that taking meds prior to the titration study will help her anxiety. She requests trying a different approach. I told her that I will discuss this with Dr Tresa EndoKelly and inform her of future orders.

## 2016-05-08 NOTE — Telephone Encounter (Signed)
If she does not want to try taking an anti-anxiolytic prior to the CPAP therapy and if she feels she will not be able to tolerate the CPAP, then consider referr her to the DME company for Desensitization prior to her CPAP titration or she may need a Pap Nap evaluation at the sleep lab prior to her CPAP.

## 2016-05-16 ENCOUNTER — Telehealth: Payer: Self-pay | Admitting: *Deleted

## 2016-05-16 ENCOUNTER — Other Ambulatory Visit: Payer: Self-pay | Admitting: *Deleted

## 2016-05-16 DIAGNOSIS — G4733 Obstructive sleep apnea (adult) (pediatric): Secondary | ICD-10-CM

## 2016-05-16 NOTE — Telephone Encounter (Signed)
Left message Dr Kelly orderTresa Endoed a [facility]. They will call her with appointment and details.

## 2016-05-22 NOTE — Telephone Encounter (Signed)
Left appointment details for desensit[facility] mask [facility]. She  Is to call them if she cannot make the appointment to reschedule.

## 2016-05-22 NOTE — Progress Notes (Signed)
Desensitization  Mask fit scheduled for February 20th @ 1:00 pm. Patient notified.

## 2016-06-06 ENCOUNTER — Ambulatory Visit (HOSPITAL_BASED_OUTPATIENT_CLINIC_OR_DEPARTMENT_OTHER): Payer: BLUE CROSS/BLUE SHIELD | Attending: Cardiovascular Disease | Admitting: Radiology

## 2016-06-06 DIAGNOSIS — G4733 Obstructive sleep apnea (adult) (pediatric): Secondary | ICD-10-CM

## 2016-06-07 ENCOUNTER — Encounter: Payer: Self-pay | Admitting: Cardiovascular Disease

## 2016-06-30 ENCOUNTER — Ambulatory Visit: Payer: BLUE CROSS/BLUE SHIELD | Admitting: Cardiovascular Disease

## 2016-07-21 ENCOUNTER — Encounter: Payer: Self-pay | Admitting: Cardiovascular Disease

## 2016-07-21 ENCOUNTER — Telehealth: Payer: Self-pay | Admitting: *Deleted

## 2016-07-21 NOTE — Telephone Encounter (Signed)
Left message to call me back today before 5 if possible otherwise I will not be available again until Wednesday of next week to discuss her CPAP device.

## 2016-07-26 ENCOUNTER — Telehealth: Payer: Self-pay | Admitting: Cardiovascular Disease

## 2016-07-26 NOTE — Telephone Encounter (Signed)
New message    Pt is calling to return Wanda's call from last week. She said if she does not answer, to leave a message.

## 2016-07-27 ENCOUNTER — Telehealth: Payer: Self-pay | Admitting: Cardiovascular Disease

## 2016-07-27 ENCOUNTER — Other Ambulatory Visit: Payer: Self-pay | Admitting: *Deleted

## 2016-07-27 ENCOUNTER — Telehealth: Payer: Self-pay | Admitting: *Deleted

## 2016-07-27 DIAGNOSIS — G4733 Obstructive sleep apnea (adult) (pediatric): Secondary | ICD-10-CM

## 2016-07-27 NOTE — Telephone Encounter (Signed)
Left message per Dr Tresa Endo that now she has been fitted for a mask she will need to have a CPAP titration study. Call back with questions or concerns. CPAP titration study ordered.

## 2016-07-27 NOTE — Telephone Encounter (Signed)
New message    Pt is returning call to [place].

## 2016-07-27 NOTE — Telephone Encounter (Signed)
Called patient to notify her of her CPAP titration appointment. She informs me that she does not want to go back to the lab. She asks if she can do CPAP auto titration at home. States that she will have to pay $500 if she returns to the lab. Informed her that I will ask dr Tresa Endo and advise her of his decision. Patient agrees with plan. I did inform her that if she does not have a in-lab study she may run in to some problems later on down the road. Insurance companies may request titration study in order to cover machine and/or supplies. Patient voiced her understanding.

## 2016-07-28 NOTE — Telephone Encounter (Signed)
Ok for CPAP auto at home

## 2016-07-31 NOTE — Telephone Encounter (Signed)
Left message that Dr Tresa Endo has agreed to let her have a 2 week auto titration. I have sent the referral to [facility]. Once they get her insurance verified they will contact her to set this up. Call me if questions or concerns.

## 2016-08-02 NOTE — Telephone Encounter (Signed)
See 4/16 telephone message.

## 2016-08-04 ENCOUNTER — Encounter: Payer: Self-pay | Admitting: Cardiovascular Disease

## 2016-08-30 ENCOUNTER — Encounter (HOSPITAL_BASED_OUTPATIENT_CLINIC_OR_DEPARTMENT_OTHER): Payer: BLUE CROSS/BLUE SHIELD

## 2016-09-08 ENCOUNTER — Encounter: Payer: Self-pay | Admitting: Cardiovascular Disease

## 2016-09-08 DIAGNOSIS — G4733 Obstructive sleep apnea (adult) (pediatric): Secondary | ICD-10-CM | POA: Diagnosis not present

## 2016-09-12 ENCOUNTER — Other Ambulatory Visit: Payer: Self-pay

## 2016-09-12 MED ORDER — VALSARTAN 160 MG PO TABS: 160.0000 mg | ORAL_TABLET | Freq: Every day | ORAL | 1 refills | Status: DC

## 2016-09-14 ENCOUNTER — Other Ambulatory Visit: Payer: Self-pay

## 2016-09-14 DIAGNOSIS — R0683 Snoring: Secondary | ICD-10-CM

## 2016-09-14 DIAGNOSIS — E669 Obesity, unspecified: Secondary | ICD-10-CM

## 2016-09-14 DIAGNOSIS — G4733 Obstructive sleep apnea (adult) (pediatric): Secondary | ICD-10-CM

## 2016-09-14 MED ORDER — FLUOXETINE HCL 20 MG PO TABS: ORAL_TABLET | ORAL | 0 refills | Status: DC

## 2016-09-14 NOTE — Telephone Encounter (Signed)
[facility] for fluoxetine.  30 day supply given.  Patient needs follow up.

## 2016-10-09 DIAGNOSIS — G4733 Obstructive sleep apnea (adult) (pediatric): Secondary | ICD-10-CM | POA: Diagnosis not present

## 2016-11-02 ENCOUNTER — Telehealth: Payer: Self-pay | Admitting: Cardiovascular Disease

## 2016-11-02 MED ORDER — IRBESARTAN 150 MG PO TABS: 150.0000 mg | ORAL_TABLET | Freq: Every day | ORAL | 0 refills | Status: DC

## 2016-11-02 MED ORDER — IRBESARTAN 150 MG PO TABS: 150.0000 mg | ORAL_TABLET | Freq: Every day | ORAL | 1 refills | Status: DC

## 2016-11-02 NOTE — Telephone Encounter (Signed)
Returned call to patient-due to recall aware to change valsartan to irbesartan 150mg  daily per pharmacy comparable dose chart.  Advised to monitor BP and notify with any significant changes.  [facility] rx [facility].   Patient aware and verbalized understanding.

## 2016-11-02 NOTE — Telephone Encounter (Signed)
Pt c/o medication issue:  1. Name of Medication: Valsartan  2. How are you currently taking this medication (dosage and times per day)?160mg  once a day   3. Are you having a reaction (difficulty breathing--STAT)? no  4. What is your medication issue? Being recalled

## 2016-11-08 DIAGNOSIS — G4733 Obstructive sleep apnea (adult) (pediatric): Secondary | ICD-10-CM | POA: Diagnosis not present

## 2016-11-20 ENCOUNTER — Ambulatory Visit: Payer: BLUE CROSS/BLUE SHIELD | Admitting: Cardiovascular Disease

## 2016-12-09 DIAGNOSIS — G4733 Obstructive sleep apnea (adult) (pediatric): Secondary | ICD-10-CM | POA: Diagnosis not present

## 2017-01-09 DIAGNOSIS — G4733 Obstructive sleep apnea (adult) (pediatric): Secondary | ICD-10-CM | POA: Diagnosis not present

## 2017-01-12 ENCOUNTER — Ambulatory Visit: Payer: BLUE CROSS/BLUE SHIELD | Admitting: Cardiovascular Disease

## 2017-02-02 ENCOUNTER — Encounter: Payer: Self-pay | Admitting: Cardiovascular Disease

## 2017-02-02 ENCOUNTER — Ambulatory Visit (INDEPENDENT_AMBULATORY_CARE_PROVIDER_SITE_OTHER): Payer: BLUE CROSS/BLUE SHIELD | Admitting: Cardiovascular Disease

## 2017-02-02 VITALS — BP 120/92 | HR 84 | Ht 66.0 in | Wt 290.0 lb

## 2017-02-02 DIAGNOSIS — Z13220 Encounter for screening for lipoid disorders: Secondary | ICD-10-CM

## 2017-02-02 DIAGNOSIS — G4733 Obstructive sleep apnea (adult) (pediatric): Secondary | ICD-10-CM | POA: Diagnosis not present

## 2017-02-02 DIAGNOSIS — I10 Essential (primary) hypertension: Secondary | ICD-10-CM

## 2017-02-02 DIAGNOSIS — Z79899 Other long term (current) drug therapy: Secondary | ICD-10-CM | POA: Diagnosis not present

## 2017-02-02 LAB — COMPREHENSIVE METABOLIC PANEL
A/G RATIO: 1.5 (ref 1.2–2.2)
ALBUMIN: 4.3 g/dL (ref 3.5–5.5)
ALT: 10 IU/L (ref 0–32)
AST: 16 IU/L (ref 0–40)
Alkaline Phosphatase: 71 IU/L (ref 39–117)
BILIRUBIN TOTAL: 0.4 mg/dL (ref 0.0–1.2)
BUN / CREAT RATIO: 25 — AB (ref 9–23)
BUN: 18 mg/dL (ref 6–20)
CHLORIDE: 104 mmol/L (ref 96–106)
CO2: 21 mmol/L (ref 20–29)
Calcium: 9.7 mg/dL (ref 8.7–10.2)
Creatinine, Ser: 0.71 mg/dL (ref 0.57–1.00)
GFR calc non Af Amer: 108 mL/min/{1.73_m2} (ref >59)
GFR, EST AFRICAN AMERICAN: 125 mL/min/{1.73_m2} (ref >59)
GLOBULIN, TOTAL: 2.9 g/dL (ref 1.5–4.5)
GLUCOSE: 94 mg/dL (ref 65–99)
POTASSIUM: 5.5 mmol/L — AB (ref 3.5–5.2)
SODIUM: 141 mmol/L (ref 134–144)
TOTAL PROTEIN: 7.2 g/dL (ref 6.0–8.5)

## 2017-02-02 LAB — CBC
HEMOGLOBIN: 14.6 g/dL (ref 11.1–15.9)
Hematocrit: 43.9 % (ref 34.0–46.6)
MCH: 29.1 pg (ref 26.6–33.0)
MCHC: 33.3 g/dL (ref 31.5–35.7)
MCV: 88 fL (ref 79–97)
PLATELETS: 268 10*3/uL (ref 150–379)
RBC: 5.02 x10E6/uL (ref 3.77–5.28)
RDW: 14.7 % (ref 12.3–15.4)
WBC: 8.6 10*3/uL (ref 3.4–10.8)

## 2017-02-02 LAB — LIPID PANEL
Chol/HDL Ratio: 3.8 ratio (ref 0.0–4.4)
Cholesterol, Total: 158 mg/dL (ref 100–199)
HDL: 42 mg/dL (ref >39)
LDL Calculated: 104 mg/dL — ABNORMAL HIGH (ref 0–99)
Triglycerides: 61 mg/dL (ref 0–149)
VLDL CHOLESTEROL CAL: 12 mg/dL (ref 5–40)

## 2017-02-02 LAB — TSH: TSH: 0.886 u[IU]/mL (ref 0.450–4.500)

## 2017-02-02 NOTE — Patient Instructions (Signed)
Medication Instructions:  Your physician recommends that you continue on your current medications as directed. Please refer to the Current Medication list given to you today.  Labwork: TODAY (CBC, CMET, Lipid, TSH)  Follow-Up: Your physician wants you to follow-up in: 12 months with Dr. Tresa EndoKelly.  You will receive a reminder letter in the mail two months in advance. If you don't receive a letter, please call our office to schedule the follow-up appointment.   Any Other Special Instructions Will Be Listed Below (If Applicable).     If you need a refill on your cardiac medications before your next appointment, please call your pharmacy.

## 2017-02-02 NOTE — Progress Notes (Signed)
Cardiology Office Note    Date:  02/02/2017   ID:  DINISHA CAI, DOB 1980/11/03, MRN [id number]  PCP:  Mellody Dance, DO  Cardiologist:  Shelva Majestic, MD  Follow-up cardiology/sleep evaluation  History of Present Illness:  Barbara Romero is a 36 y.o. female who was referred through the courtesy of Dr. Mellody Dance for sleep evaluation and hypertension. I saw her initially in December 2017.  She presents for follow-up evaluation.  Barbara Romero has a long-standing history of morbid obesity, and while she was living in [place], [place] in 2002 was scheduled to undergo a sleep study.  However, on the night of her planned sleep study, it had snowed in her study was canceled.  She never followed up with this and never had subsequent evaluation.  She now lives in the [place] area at home with her Barbara Romero.  Over the past 15 years she  continued to have difficulty with sleep.  Recently, she has been noted to have very loud snoring as well as witnessed apnea.  She often wakes up 3-4 times per night and has frequent nocturia.  Typically she goes to bed between 1 AM and 2 AM and wakes up at 9 AM.  Her sleep is nonrestorative.  Over the last several years.  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  She does not exercise.  She has been smoking at least one pack per day for 22 years.  She has noticed progressive weight gain.  She was  evaluated by Dr Raliegh Scarlet and was felt to have a generalized anxiety disorder and was given a prescription for Prozac.  During her evaluation, she had diastolic hypertension with a blood pressure of 134/92.  At that time she weighed 302 pounds and BMI was 48.06 kg/m.  She was referred for evaluation.  During her initial evaluation, her Bang score was 6/8.  An Epworth Sleepiness Scale score was calculated in the office and this endorsed that 6 with a moderate chance of dozing while  sitting and reading, a high chance of dozing while lying down to rest in the afternoon when circumstances permit, and a slight chance of dozing while watching television.  When I saw Barbara Romero, I felt she had symptoms very highly suggestive of obstructive sleep apnea.  She also was hypertensive.  I scheduled her for an echo Doppler study to evaluate for hypertensive heart disease.  This was done on 04/04/2016 and showed an EF of 60-65%.  Diastolic parameters were normal.  There was mild LVH.  There is no significant valvular abnormalities.  I referred her for sleep study which was done on 04/04/2016.  This revealed very severe sleep apnea with an HI of 105.7 per hour.  She was unable to have any rim sleep.  There was loud snoring.  Her oxygen desaturation nadir was 84%.  The patient became very anxious when CPAP was attempted and as result, the PSG study was resumed.  I recommended CPAP desensitization or Evaluation which she had done.  Subsequently she received an auto Pap unit in late March and has an air since 10 set.  AutoSet CPAP.  She feels significantly improved since initiating CPAP therapy.  I obtained a download in the office today from September 19 through 02/01/2017, which shows excellent compliance.  She is averaging almost 7 hours and 30 minutes of sleep per night.  Her AHI is  0.9 per hour.  95th percentile, average pressures 13.9 and maximum average pressure 16.3.  Previously she was getting up 3-4 times per night for urination.  She now almost sleeps through the entire night were only gets up once.  An Epworth Sleepiness Scale score was recalculated in the office today and this endorsed at 4.  DME company is [facility]  Initially she was treated with valsartan for hypertension but was switched irbesartan as result of the valsartan impurity recognition.  She has been on irbesartan 150 mg daily, which she tolerates.  She presents for follow-up.  Cardiological and sleep  evaluation.   Past Medical History:  Diagnosis Date  . GERD (gastroesophageal reflux disease)     Past Surgical History:  Procedure Laterality Date  . BACK SURGERY     L5    Current Medications: Outpatient Medications Prior to Visit  Medication Sig Dispense Refill  . aspirin 325 MG tablet Take 650 mg by mouth every 4 (four) hours as needed for mild pain or headache.     . cholecalciferol (VITAMIN D) 1000 units tablet Take 2,000 Units by mouth daily.    . irbesartan (AVAPRO) 150 MG tablet Take 1 tablet (150 mg total) by mouth daily. 90 tablet 1  . omeprazole (PRILOSEC OTC) 20 MG tablet Take 20 mg by mouth daily.    . Magnesium 250 MG TABS Take 250 mg by mouth daily.     No facility-administered medications prior to visit.      Allergies:   Hydrocodone   Social History   Social History  . Marital status: Single    Spouse name: N/A  . Number of children: N/A  . Years of education: N/A   Social History Main Topics  . Smoking status: Current Every Day Smoker    Packs/day: 1.00    Years: 23.00    Types: Cigarettes  . Smokeless tobacco: Never Used  . Alcohol use Yes     Comment: social  . Drug use: No  . Sexual activity: Not Currently    Birth control/ protection: None   Other Topics Concern  . None   Social History Narrative  . None     Family History:  The patient's  family history includes Cancer in her father; Hypertension in her father. .  Her mother is alive at 57.  Her father is 66.  She had one sister who died in a car accident at age 59.   ROS General: Negative; No fevers, chills, or night sweats; positive for morbid obesity HEENT: Negative; No changes in vision or hearing, sinus congestion, difficulty swallowing Pulmonary: Negative; No cough, wheezing, shortness of breath, hemoptysis Cardiovascular: Negative; No chest pain, presyncope, syncope, palpitations GI: Negative; No nausea, vomiting, diarrhea, or abdominal pain GU: Negative; No dysuria,  hematuria, or difficulty voiding Musculoskeletal: Negative; no myalgias, joint pain, or weakness Hematologic/Oncology: Negative; no easy bruising, bleeding Endocrine: Negative; no heat/cold intolerance; no diabetes Neuro: Negative; no changes in balance, headaches Skin: Negative; No rashes or skin lesions Psychiatric: Negative; No behavioral problems, depression Sleep: Positive for snoring, witnessed apnea, nonrestorative sleep, and occasional daytime sleepiness; no bruxism, restless legs, hypnogognic hallucinations, no cataplexy; now on CPAP with resolution Other comprehensive 14 point system review is negative.   PHYSICAL EXAM:   VS:  BP (!) 120/92   Pulse 84   Ht _0  (1.676 m)   Wt 290 lb (131.5 kg)   BMI 46.81 kg/m     Repeat blood pressure by  me was 120/78  Wt Readings from Last 3 Encounters:  02/02/17 290 lb (131.5 kg)  04/04/16 293 lb (132.9 kg)  03/31/16 299 lb (135.6 kg)    General: Alert, oriented, no distress. Morbidly obese Skin: normal turgor, no rashes, warm and dry HEENT: Normocephalic, atraumatic. Pupils equal round and reactive to light; sclera anicteric; extraocular muscles intact;  Nose without nasal septal hypertrophy Mouth/Parynx benign; Mallinpatti scale 3/4 Neck: Thick neck No JVD, no carotid bruits; normal carotid upstroke Lungs: clear to ausculatation and percussion; no wheezing or rales Chest wall: without tenderness to palpitation Heart: PMI not displaced, RRR, s1 s2 normal, 1/6 systolic murmur, no diastolic murmur, no rubs, gallops, thrills, or heaves Abdomen: Central adiposity; soft, nontender; no hepatosplenomehaly, BS+; abdominal aorta nontender and not dilated by palpation. Back: no CVA tenderness Pulses 2+ Musculoskeletal: full range of motion, normal strength, no joint deformities Extremities: no clubbing cyanosis or edema, Homan's sign negative  Neurologic: grossly nonfocal; Cranial nerves grossly wnl Psychologic: Normal mood and  affect    Studies/Labs Reviewed:   ECG (independently read by me): Normal sinus rhythm at 84 bpm.  No ectopy.  Normal intervals.  December 2017 ECG (independently read by me): Sinus tachycardia 101 bpm.  Possible underlying right atrial enlargement  Recent Labs: BMP Latest Ref Rng & Units 04/14/2016 03/17/2016 01/14/2016  Glucose 65 - 99 mg/dL 94 95 101(H)  BUN 7 - 25 mg/dL _0 Creatinine 0.50 - 1.10 mg/dL 0.85 0.77 0.84  Sodium 135 - 146 mmol/L 137 139 137  Potassium 3.5 - 5.3 mmol/L 4.7 5.0 4.1  Chloride 98 - 110 mmol/L 103 105 107  CO2 20 - 31 mmol/L 25 26 21(L)  Calcium 8.6 - 10.2 mg/dL 9.7 9.6 9.3     Hepatic Function Latest Ref Rng & Units 03/17/2016 01/14/2016 06/29/2013  Total Protein 6.1 - 8.1 g/dL 7.0 8.3(H) 7.1  Albumin 3.6 - 5.1 g/dL 4.1 4.5 3.6  AST 10 - 30 U/L _1 ALT 6 - 29 U/L _2 Alk Phosphatase 33 - 115 U/L 59 60 68  Total Bilirubin 0.2 - 1.2 mg/dL 0.7 0.7 <0.2(L)  Bilirubin, Direct - - - -    CBC Latest Ref Rng & Units 03/17/2016 01/14/2016 06/29/2013  WBC 3.8 - 10.8 K/uL 8.9 11.2(H) 10.4  Hemoglobin 11.7 - 15.5 g/dL 15.8(H) 16.1(H) 14.4  Hematocrit 35.0 - 45.0 % 47.9(H) 47.8(H) 41.7  Platelets 140 - 400 K/uL 245 255 265   Lab Results  Component Value Date   MCV 90.4 03/17/2016   MCV 89.0 01/14/2016   MCV 89.3 06/29/2013   Lab Results  Component Value Date   TSH 1.17 03/17/2016   Lab Results  Component Value Date   HGBA1C 5.1 03/17/2016     BNP No results found for: BNP  ProBNP No results found for: PROBNP   Lipid Panel     Component Value Date/Time   CHOL 185 03/17/2016 0856   TRIG 89 03/17/2016 0856   HDL 44 (L) 03/17/2016 0856   CHOLHDL 4.2 03/17/2016 0856   VLDL 18 03/17/2016 0856   LDLCALC 123 (H) 03/17/2016 0856     RADIOLOGY: No results found.   Additional studies/ records that were reviewed today include:  I reviewed the recent evaluation from Dr. Raliegh Scarlet; I reviewed her echo Doppler data, sleep study,  and in the office today obtained a download from her CPAP unit.    ASSESSMENT:    1. Essential hypertension, benign  2. OSA (obstructive sleep apnea)   3. Medication management   4. Lipid screening   5. Morbid obesity      PLAN:  Barbara Romero is a 36 year old female who has a long-standing history of tobacco use for over 22 years, as well as symptom complex, highly suggestive of obstructive sleep apnea for approximally 15 years.  She has a history of morbid obesity.  When I initially saw her, she had stage II hypertension and was not on any antihypertensive medication.  I initiated valsartan for ARB therapy, which she tolerated, but ultimately this was switched to irbesartan 150 mg when some generic versions of valsartan were found to have impurity resulting from a company in [place] supplying the valsartan powder.  I reviewed her echo Doppler study with her in detail.  This showed normal systolic function and diastolic function.  Although there was evidence for mild LVH.  There was no significant valvular pathology.  I spent considerable time with her today and reviewed her sleep study in detail.  She had very severe sleep apnea and was unable to achieve rems sleep.  There was loud snoring and significant oxygen desaturation.  Initial AHI was 105.7 per hour.  Since she been on CPAP therapy.  She has noticed a dramatic improvement in her sense of well-being.  She has more energy.  She is not sleepy.  She is sleeping almost through the night and early wakes up in that most urinates one time per night.  A download today confirms excellent compliance.  AHI is excellent at 0.9.  I have adjusted her auto unit, such that her minimum pressure will be set at 8 to maximum of 20.  I reduced from time to 5 minutes.  She is now tolerating a respirometer Phillips nasal mask which she prefers over the nasal pillows.  Her blood pressure today is stable.  Her ECG is normal.  I am repeating laboratory.  Tikosyn.   She is now on irbesartan.  I will also reassess lipid studies since her LDL cholesterol last year was 123.  I will contact her with the results of laboratory.  She will return to the primary care of Dr.Opalsky .  As long as she is stable, I will see her in one year for reevaluation.  Medication Adjustments/Labs and Tests Ordered: Current medicines are reviewed at length with the patient today.  Concerns regarding medicines are outlined above.  Medication changes, Labs and Tests ordered today are listed in the Patient Instructions below.  Patient Instructions  Medication Instructions:  Your physician recommends that you continue on your current medications as directed. Please refer to the Current Medication list given to you today.  Labwork: TODAY (CBC, CMET, Lipid, TSH)  Follow-Up: Your physician wants you to follow-up in: 12 months with Dr. Claiborne Billings.  You will receive a reminder letter in the mail two months in advance. If you don't receive a letter, please call our office to schedule the follow-up appointment.   Any Other Special Instructions Will Be Listed Below (If Applicable).     If you need a refill on your cardiac medications before your next appointment, please call your pharmacy.      Signed, Shelva Majestic, MD  02/02/2017 11:40 AM    [facility] [address], [place], [place], [place]  [address] Phone: [phone]

## 2017-02-07 ENCOUNTER — Encounter (HOSPITAL_COMMUNITY): Payer: Self-pay | Admitting: Emergency Medicine

## 2017-02-07 ENCOUNTER — Ambulatory Visit (HOSPITAL_COMMUNITY)
Admission: EM | Admit: 2017-02-07 | Discharge: 2017-02-07 | Disposition: A | Discharge status: Home / Self Care | Payer: BLUE CROSS/BLUE SHIELD | Attending: Family Medicine | Admitting: Family Medicine

## 2017-02-07 DIAGNOSIS — R0789 Other chest pain: Secondary | ICD-10-CM

## 2017-02-07 HISTORY — DX: Essential (primary) hypertension: I10

## 2017-02-07 NOTE — ED Triage Notes (Signed)
PT reports left sided chest tightness that started at 1pm. PT reports she sitting down using her computer when it started. PT has history of anxiety, but this is different.  PT reports around 4pm she became aware of left arm pain as well.  PT had an EKG on Friday when she saw cardiology for post sleep study appointment.   PT was told her potassium levels were high Friday night.

## 2017-02-07 NOTE — Discharge Instructions (Signed)
Please return here or to the Emergency Department immediately should you feel worse in any way or have any of the following symptoms: increasing or different chest pain, pain that spreads to your arm, neck, jaw, back or abdomen, shortness of breath, or nausea and vomiting. ° °

## 2017-02-08 DIAGNOSIS — G4733 Obstructive sleep apnea (adult) (pediatric): Secondary | ICD-10-CM | POA: Diagnosis not present

## 2017-02-08 NOTE — ED Provider Notes (Signed)
  [facility]   [id number] 02/07/17 Arrival Time: 1914  ASSESSMENT & PLAN:  1. Atypical chest pain    ECG without acute abnormalities. She prefers to monitor symptoms overnight but agrees to proceed to the ED with any new or worsening symptoms. Reviewed expectations re: course of current medical issues. Questions answered. Outlined signs and symptoms indicating need for more acute intervention. Patient verbalized understanding. After Visit Summary given.   SUBJECTIVE:  Barbara Romero is a 36 y.o. female who presents with complaint of:  Chest Pain: Onset abrupt, 1 day ago, with stable course since that time. Describes discomfort as intermittent, pressure like in nature, does not radiate. Patient rates pain as a 2/10 in intensity. Associated symptoms: none. Aggravating factors: none. Alleviating factors: none. Cardiac risk factors: smoking/ tobacco exposure. No h/o similar symptoms. OTC treatment: None.  ROS: As per HPI.   OBJECTIVE:  Vitals:   02/07/17 1950  BP: (!) 143/96  Pulse: 86  Resp: 16  Temp: 98 F (36.7 C)  TempSrc: Oral  SpO2: 100%  Weight: 290 lb (131.5 kg)  Height: 5\' 6"  (1.676 m)    General appearance: alert; no distress Eyes: PERRLA; EOMI; conjunctiva normal HENT: normocephalic; atraumatic Neck: supple Lungs: clear to auscultation bilaterally Heart: regular rate and rhythm Chest Wall: mild anterior tenderness to palpation reported Abdomen: soft, non-tender; bowel sounds normal; no masses or organomegaly; no guarding or rebound tenderness Extremities: no cyanosis or edema; symmetrical with no gross deformities Skin: warm and dry Psychological: alert and cooperative; normal mood and affect   Allergies  Allergen Reactions  . Hydrocodone Nausea Only and Other (See Comments)    Disoriented     Past Medical History:  Diagnosis Date  . GERD (gastroesophageal reflux disease)   . Hypertension    Social History   Social History  .  Marital status: Single    Spouse name: N/A  . Number of children: N/A  . Years of education: N/A   Occupational History  . Not on file.   Social History Main Topics  . Smoking status: Current Every Day Smoker    Packs/day: 1.00    Years: 23.00    Types: Cigarettes  . Smokeless tobacco: Never Used  . Alcohol use Yes     Comment: social  . Drug use: No  . Sexual activity: Not Currently    Birth control/ protection: None   Other Topics Concern  . Not on file   Social History Narrative  . No narrative on file   Family History  Problem Relation Age of Onset  . Cancer Father        prostate  . Hypertension Father    Past Surgical History:  Procedure Laterality Date  . BACK SURGERY     L5     Mardella LaymanHagler, Lynore Coscia, MD 02/08/17 1045

## 2017-02-09 ENCOUNTER — Other Ambulatory Visit: Payer: Self-pay | Admitting: *Deleted

## 2017-02-09 ENCOUNTER — Other Ambulatory Visit: Payer: Self-pay

## 2017-02-09 DIAGNOSIS — E875 Hyperkalemia: Secondary | ICD-10-CM

## 2017-02-09 DIAGNOSIS — Z79899 Other long term (current) drug therapy: Secondary | ICD-10-CM

## 2017-02-09 MED ORDER — OMEPRAZOLE MAGNESIUM 20 MG PO TBEC: 20.0000 mg | DELAYED_RELEASE_TABLET | Freq: Every day | ORAL | 2 refills | Status: DC

## 2017-02-12 DIAGNOSIS — E875 Hyperkalemia: Secondary | ICD-10-CM | POA: Diagnosis not present

## 2017-02-12 DIAGNOSIS — Z79899 Other long term (current) drug therapy: Secondary | ICD-10-CM | POA: Diagnosis not present

## 2017-02-12 LAB — BASIC METABOLIC PANEL
BUN / CREAT RATIO: 15 (ref 9–23)
BUN: 13 mg/dL (ref 6–20)
CHLORIDE: 105 mmol/L (ref 96–106)
CO2: 22 mmol/L (ref 20–29)
Calcium: 9.5 mg/dL (ref 8.7–10.2)
Creatinine, Ser: 0.88 mg/dL (ref 0.57–1.00)
GFR calc non Af Amer: 84 mL/min/{1.73_m2} (ref >59)
GFR, EST AFRICAN AMERICAN: 96 mL/min/{1.73_m2} (ref >59)
GLUCOSE: 89 mg/dL (ref 65–99)
Potassium: 4.3 mmol/L (ref 3.5–5.2)
SODIUM: 142 mmol/L (ref 134–144)

## 2017-02-14 ENCOUNTER — Other Ambulatory Visit: Payer: Self-pay | Admitting: *Deleted

## 2017-02-14 MED ORDER — OMEPRAZOLE MAGNESIUM 20 MG PO TBEC: 20.0000 mg | DELAYED_RELEASE_TABLET | Freq: Every day | ORAL | 1 refills | Status: DC

## 2017-02-15 ENCOUNTER — Other Ambulatory Visit: Payer: Self-pay | Admitting: *Deleted

## 2017-02-15 ENCOUNTER — Encounter: Payer: Self-pay | Admitting: *Deleted

## 2017-02-15 MED ORDER — OMEPRAZOLE 20 MG PO CPDR: 20.0000 mg | DELAYED_RELEASE_CAPSULE | Freq: Every day | ORAL | 3 refills | Status: DC

## 2017-03-11 DIAGNOSIS — G4733 Obstructive sleep apnea (adult) (pediatric): Secondary | ICD-10-CM | POA: Diagnosis not present

## 2017-04-10 DIAGNOSIS — G4733 Obstructive sleep apnea (adult) (pediatric): Secondary | ICD-10-CM | POA: Diagnosis not present

## 2017-05-09 ENCOUNTER — Other Ambulatory Visit: Payer: Self-pay | Admitting: *Deleted

## 2017-05-09 MED ORDER — IRBESARTAN 150 MG PO TABS: 150.0000 mg | ORAL_TABLET | Freq: Every day | ORAL | 1 refills | Status: DC

## 2017-05-10 ENCOUNTER — Other Ambulatory Visit: Payer: Self-pay | Admitting: *Deleted

## 2017-05-10 MED ORDER — IRBESARTAN 150 MG PO TABS: 150.0000 mg | ORAL_TABLET | Freq: Every day | ORAL | 2 refills | Status: DC

## 2017-05-11 DIAGNOSIS — G4733 Obstructive sleep apnea (adult) (pediatric): Secondary | ICD-10-CM | POA: Diagnosis not present

## 2017-05-24 ENCOUNTER — Other Ambulatory Visit: Payer: Self-pay | Admitting: Cardiovascular Disease

## 2017-05-24 NOTE — Telephone Encounter (Signed)
REFILL 

## 2017-05-25 ENCOUNTER — Other Ambulatory Visit: Payer: Self-pay | Admitting: Cardiovascular Disease

## 2017-05-28 NOTE — Telephone Encounter (Signed)
REFILL 

## 2017-06-11 DIAGNOSIS — G4733 Obstructive sleep apnea (adult) (pediatric): Secondary | ICD-10-CM | POA: Diagnosis not present

## 2017-06-15 ENCOUNTER — Encounter: Payer: Self-pay | Admitting: Physician Assistant

## 2017-06-15 ENCOUNTER — Ambulatory Visit (INDEPENDENT_AMBULATORY_CARE_PROVIDER_SITE_OTHER): Payer: Self-pay | Admitting: Physician Assistant

## 2017-06-15 ENCOUNTER — Other Ambulatory Visit: Payer: Self-pay | Admitting: *Deleted

## 2017-06-15 VITALS — BP 124/80 | HR 80 | Ht 66.0 in | Wt 289.2 lb

## 2017-06-15 DIAGNOSIS — I10 Essential (primary) hypertension: Secondary | ICD-10-CM

## 2017-06-15 MED ORDER — IRBESARTAN 150 MG PO TABS: 150.0000 mg | ORAL_TABLET | Freq: Every day | ORAL | 3 refills | Status: DC

## 2017-06-15 NOTE — Patient Instructions (Signed)
NO CHANGES WITH CURRENT TREATMENT

## 2017-06-15 NOTE — Progress Notes (Signed)
Barbara Romero is a 37 y.o. female with PMH of HTN, generalized anxiety disorder and morbid obesity.  Patient primarily sees Dr. Tresa EndoKelly for management of blood pressure and obstructive sleep apnea.  She previously had a incomplete sleep study in [name] in [place], however the study was interrupted due to snow.  He was seen by Dr. Tresa EndoKelly on 03/31/2016, valsartan 160 mg was started. She did undergo split-night sleep study in December 2017, however he was restless throughout the entire study and has problems tolerating the CPAP during sleep.  Last echocardiogram obtained on 04/04/2016 showed EF 60-65%, mild LVH with normal RV systolic function.  She did go to the local urgent care in October 2018 for atypical chest pain.  Lab work was normal except for elevated potassium of 5.5.  This was later repeated which came back down to normal.  TSH was normal.  EKG showed no obvious changes.   Patient was last seen by Dr. Tresa EndoKelly in October 2018.  She has been doing well since without any issues.  It is unclear to me why today's appointment was made as the patient is completely asymptomatic.  She has not had any chest pain for the past 6639-month and that there was only one episode of chest pain in Oct 2018.  She is able to ambulate a mile a day without any issue.  I think this appointment was made by mistake as Dr. Landry DykeKelly's last note was put in as clinical support instead of office note.  Therefore it is possible our scheduler did not see the office visit.  After discussing with the patient, we both felt that she did not need today's office visit.  Therefore it will be canceled.  She was agreeable with this decision.  Ramond DialSigned, Fotios Amos PA Pager: [phone]

## 2017-07-02 ENCOUNTER — Other Ambulatory Visit: Payer: Self-pay

## 2017-07-02 MED ORDER — IRBESARTAN 150 MG PO TABS: 150.0000 mg | ORAL_TABLET | Freq: Every day | ORAL | 3 refills | Status: DC

## 2017-07-09 DIAGNOSIS — G4733 Obstructive sleep apnea (adult) (pediatric): Secondary | ICD-10-CM | POA: Diagnosis not present

## 2017-10-19 ENCOUNTER — Encounter: Payer: Self-pay | Admitting: *Deleted

## 2017-10-19 ENCOUNTER — Other Ambulatory Visit: Payer: Self-pay | Admitting: *Deleted

## 2017-10-19 MED ORDER — VALSARTAN 160 MG PO TABS: 160.0000 mg | ORAL_TABLET | Freq: Every day | ORAL | 3 refills | Status: DC

## 2018-09-26 ENCOUNTER — Other Ambulatory Visit: Payer: Self-pay | Admitting: Cardiovascular Disease

## 2018-10-21 ENCOUNTER — Other Ambulatory Visit: Payer: Self-pay | Admitting: Cardiovascular Disease

## 2018-10-21 NOTE — Telephone Encounter (Signed)
Rx(s) sent to pharmacy electronically.  

## 2018-11-20 ENCOUNTER — Other Ambulatory Visit: Payer: Self-pay | Admitting: Cardiovascular Disease

## 2018-12-09 ENCOUNTER — Other Ambulatory Visit: Payer: Self-pay

## 2019-01-06 ENCOUNTER — Other Ambulatory Visit: Payer: Self-pay | Admitting: Cardiovascular Disease

## 2019-01-14 ENCOUNTER — Other Ambulatory Visit: Payer: Self-pay | Admitting: Cardiovascular Disease

## 2019-02-12 ENCOUNTER — Other Ambulatory Visit: Payer: Self-pay

## 2019-02-12 ENCOUNTER — Ambulatory Visit: Payer: BLUE CROSS/BLUE SHIELD | Admitting: Physician Assistant

## 2019-02-12 ENCOUNTER — Ambulatory Visit (INDEPENDENT_AMBULATORY_CARE_PROVIDER_SITE_OTHER): Payer: BC Managed Care – PPO | Admitting: Cardiology

## 2019-02-12 ENCOUNTER — Encounter: Payer: Self-pay | Admitting: Cardiology

## 2019-02-12 VITALS — BP 148/101 | HR 100 | Temp 100.3°F | Ht 66.0 in | Wt 276.2 lb

## 2019-02-12 DIAGNOSIS — Z20828 Contact with and (suspected) exposure to other viral communicable diseases: Secondary | ICD-10-CM | POA: Diagnosis not present

## 2019-02-12 DIAGNOSIS — F17200 Nicotine dependence, unspecified, uncomplicated: Secondary | ICD-10-CM

## 2019-02-12 DIAGNOSIS — G4733 Obstructive sleep apnea (adult) (pediatric): Secondary | ICD-10-CM

## 2019-02-12 DIAGNOSIS — I10 Essential (primary) hypertension: Secondary | ICD-10-CM

## 2019-02-12 DIAGNOSIS — R509 Fever, unspecified: Secondary | ICD-10-CM

## 2019-02-12 LAB — BASIC METABOLIC PANEL
BUN/Creatinine Ratio: 19 (ref 9–23)
BUN: 12 mg/dL (ref 6–24)
CO2: 19 mmol/L — ABNORMAL LOW (ref 20–29)
Calcium: 9.4 mg/dL (ref 8.7–10.2)
Chloride: 103 mmol/L (ref 96–106)
Creatinine, Ser: 0.62 mg/dL (ref 0.57–1.00)
GFR calc Af Amer: 130 mL/min/{1.73_m2} (ref >59)
GFR calc non Af Amer: 113 mL/min/{1.73_m2} (ref >59)
Glucose: 84 mg/dL (ref 65–99)
Potassium: 4.2 mmol/L (ref 3.5–5.2)
Sodium: 136 mmol/L (ref 134–144)

## 2019-02-12 LAB — CBC WITH DIFFERENTIAL/PLATELET
Basophils Absolute: 0.1 10*3/uL (ref 0.0–0.2)
Basos: 1 %
EOS (ABSOLUTE): 0.2 10*3/uL (ref 0.0–0.4)
Eos: 2 %
Hematocrit: 46.1 % (ref 34.0–46.6)
Hemoglobin: 16.2 g/dL — ABNORMAL HIGH (ref 11.1–15.9)
Immature Grans (Abs): 0 10*3/uL (ref 0.0–0.1)
Immature Granulocytes: 0 %
Lymphocytes Absolute: 2.5 10*3/uL (ref 0.7–3.1)
Lymphs: 23 %
MCH: 32 pg (ref 26.6–33.0)
MCHC: 35.1 g/dL (ref 31.5–35.7)
MCV: 91 fL (ref 79–97)
Monocytes Absolute: 0.7 10*3/uL (ref 0.1–0.9)
Monocytes: 6 %
Neutrophils Absolute: 7.4 10*3/uL — ABNORMAL HIGH (ref 1.4–7.0)
Neutrophils: 68 %
Platelets: 257 10*3/uL (ref 150–450)
RBC: 5.06 x10E6/uL (ref 3.77–5.28)
RDW: 12.4 % (ref 11.7–15.4)
WBC: 10.8 10*3/uL (ref 3.4–10.8)

## 2019-02-12 MED ORDER — VALSARTAN 160 MG PO TABS: 160.0000 mg | ORAL_TABLET | Freq: Every day | ORAL | 1 refills | Status: DC

## 2019-02-12 MED ORDER — VALSARTAN 160 MG PO TABS: 160.0000 mg | ORAL_TABLET | Freq: Every day | ORAL | 3 refills | Status: DC

## 2019-02-12 NOTE — Assessment & Plan Note (Signed)
1 PPD 

## 2019-02-12 NOTE — Addendum Note (Signed)
Addended by: Ulice Brilliant T on: 02/12/2019 10:49 AM   Modules accepted: Orders

## 2019-02-12 NOTE — Progress Notes (Signed)
Cardiology Office Note:    Date:  02/12/2019   ID:  Barbara Romero, DOB [birth year], MRN [id number]  PCP:  Thomasene Lot, DO  Cardiologist:  No primary care provider on file.  Electrophysiologist:  None   Referring MD: Thomasene Lot, DO   No chief complaint on file.   History of Present Illness:    Barbara Romero is a 38 y.o. female with a hx of obesity, OSA on C-pap, smoking and HTN.  She presents to the office today for exam to get her medications refilled.  She denies any symptoms of chest pain or dyspnea.  She continues to smoke 1PPD. She does walk 30-40 minutes daily.  She works from Probation [employer]".  She lives in the same home as her parents. In the office she was noted to have a temperature of 101.  She denies any exposure to COVID.    Past Medical History:  Diagnosis Date  . GERD (gastroesophageal reflux disease)   . Hypertension     Past Surgical History:  Procedure Laterality Date  . BACK SURGERY     L5    Current Medications: Current Meds  Medication Sig  . cholecalciferol (VITAMIN D) 1000 units tablet Take 2,000 Units by mouth daily.  . valsartan (DIOVAN) 160 MG tablet Take 1 tablet (160 mg total) by mouth daily. Please make annual appt with Dr. Tresa Endo for future refills. [phone]. 3rd & final attempt     Allergies:   Hydrocodone   Social History   Socioeconomic History  . Marital status: Single    Spouse name: Not on file  . Number of children: Not on file  . Years of education: Not on file  . Highest education level: Not on file  Occupational History  . Not on file  Social Needs  . Financial resource strain: Not on file  . Food insecurity    Worry: Not on file    Inability: Not on file  . Transportation needs    Medical: Not on file    Non-medical: Not on file  Tobacco Use  . Smoking status: Current Every Day Smoker    Packs/day: 1.00    Years: 23.00    Pack years: 23.00    Types: Cigarettes  . Smokeless tobacco: Never Used   Substance and Sexual Activity  . Alcohol use: Yes    Comment: social  . Drug use: No  . Sexual activity: Not Currently    Birth control/protection: None  Lifestyle  . Physical activity    Days per week: Not on file    Minutes per session: Not on file  . Stress: Not on file  Relationships  . Social Musician on phone: Not on file    Gets together: Not on file    Attends religious service: Not on file    Active member of club or organization: Not on file    Attends meetings of clubs or organizations: Not on file    Relationship status: Not on file  Other Topics Concern  . Not on file  Social History Narrative  . Not on file     Family History: The patient's family history includes Cancer in her father; Hypertension in her father.  ROS:   Please see the history of present illness.     All other systems reviewed and are negative.  EKGs/Labs/Other Studies Reviewed:     Recent Labs: No results found for requested labs within last 8760 hours.  Recent Lipid Panel    Component Value Date/Time   CHOL 158 02/02/2017 1024   TRIG 61 02/02/2017 1024   HDL 42 02/02/2017 1024   CHOLHDL 3.8 02/02/2017 1024   CHOLHDL 4.2 03/17/2016 0856   VLDL 18 03/17/2016 0856   LDLCALC 104 (H) 02/02/2017 1024    Physical Exam:    VS:  BP (!) 148/101   Pulse 100   Temp 100.3 F (37.9 C)   Ht 5\' 6"  (1.676 m)   Wt 276 lb 3.2 oz (125.3 kg)   BMI 44.58 kg/m     Wt Readings from Last 3 Encounters:  02/12/19 276 lb 3.2 oz (125.3 kg)  06/15/17 289 lb 3.2 oz (131.2 kg)  02/07/17 290 lb (131.5 kg)    EKG- 02/12/2019- NSR HR 90  GEN: Morbidly obese female, in no acute distress HEENT: Normal NECK: No JVD; No carotid bruits LYMPHATICS: No lymphadenopathy CARDIAC: RRR, no murmurs, rubs, gallops RESPIRATORY:  Clear to auscultation without rales, wheezing or rhonchi  ABDOMEN: Soft, non-tender, non-distended MUSCULOSKELETAL:  No edema; No deformity  SKIN: Warm and  dry NEUROLOGIC:  Alert and oriented x 3 PSYCHIATRIC:  Normal affect   ASSESSMENT:    Essential hypertension B/P elevated- 142/100 though patient is febrile  OSA (obstructive sleep apnea) She reports compliance with C-pap  Tobacco use disorder 1 PPD  Morbid obesity BMI 44- she has actually lost > 25 lbs over the past year  PLAN:    I examined the patient with PPE-gown, gloves, full facemask.  I suggested the patient go home and self isolate for 14 days.  Since she lives with her parents I directed her to the Health department for COVID testing.  I did order labs today- CBD with diff and BMP.  I have asked her check her B/P at home for the next few weeks.  I will consider adding HCTZ depending on her BMP.  Virtual OV with me in two weeks.    Medication Adjustments/Labs and Tests Ordered: Current medicines are reviewed at length with the patient today.  Concerns regarding medicines are outlined above.  Orders Placed This Encounter  Procedures  . CBC with Differential  . Basic Metabolic Panel (BMET)  . EKG 12-Lead   No orders of the defined types were placed in this encounter.   Patient Instructions  Medication Instructions:  Your physician recommends that you continue on your current medications as directed. Please refer to the Current Medication list given to you today. *If you need a refill on your cardiac medications before your next appointment, please call your pharmacy*  Lab Work: Your physician recommends that you return for lab work in: TODAY-BMET, CBC If you have labs (blood work) drawn today and your tests are completely normal, you will receive your results only by: Marland Kitchen. MyChart Message (if you have MyChart) OR . A paper copy in the mail If you have any lab test that is abnormal or we need to change your treatment, we will call you to review the results.  Testing/Procedures: NONE   Follow-Up: At [facility], you and your health needs are our priority.  As  part of our continuing mission to provide you with exceptional heart care, we have created designated Provider Care Teams.  These Care Teams include your primary Cardiologist (physician) and Advanced Practice Providers (APPs -  Physician Assistants and Nurse Practitioners) who all work together to provide you with the care you need, when you need it.  Your next appointment:  2 WEEKS   The format for your next appointment:   Virtual Visit   Provider:   Kerin Ransom, PA-C  Other Instructions PLEASE [place] AT [place] UNLESS [place] TEST COMES BACK NEGATIVE    Signed, Kerin Ransom, PA-C  02/12/2019 9:34 AM    [place]

## 2019-02-12 NOTE — Assessment & Plan Note (Signed)
She reports compliance with C-pap 

## 2019-02-12 NOTE — Patient Instructions (Addendum)
Medication Instructions:  Your physician recommends that you continue on your current medications as directed. Please refer to the Current Medication list given to you today. *If you need a refill on your cardiac medications before your next appointment, please call your pharmacy*  Lab Work: Your physician recommends that you return for lab work in: TODAY-BMET, CBC If you have labs (blood work) drawn today and your tests are completely normal, you will receive your results only by: Marland Kitchen MyChart Message (if you have MyChart) OR . A paper copy in the mail If you have any lab test that is abnormal or we need to change your treatment, we will call you to review the results.  Testing/Procedures: NONE   Follow-Up: At [facility], you and your health needs are our priority.  As part of our continuing mission to provide you with exceptional heart care, we have created designated Provider Care Teams.  These Care Teams include your primary Cardiologist (physician) and Advanced Practice Providers (APPs -  Physician Assistants and Nurse Practitioners) who all work together to provide you with the care you need, when you need it.  Your next appointment:   2 WEEKS   The format for your next appointment:   Virtual Visit   Provider:   Kerin Ransom, PA-C  Other Instructions PLEASE GO TO [place] AT [place] UNLESS YOUR COVID TEST COMES BACK NEGATIVE

## 2019-02-12 NOTE — Assessment & Plan Note (Signed)
BMI 44- she has actually lost > 25 lbs over the past year

## 2019-02-12 NOTE — Assessment & Plan Note (Signed)
B/P elevated- 142/100 though patient is febrile

## 2019-02-26 ENCOUNTER — Telehealth: Payer: Self-pay

## 2019-02-26 ENCOUNTER — Telehealth (INDEPENDENT_AMBULATORY_CARE_PROVIDER_SITE_OTHER): Payer: BC Managed Care – PPO | Admitting: Cardiology

## 2019-02-26 ENCOUNTER — Encounter: Payer: Self-pay | Admitting: Cardiology

## 2019-02-26 VITALS — BP 139/77 | HR 92 | Ht 66.0 in | Wt 273.0 lb

## 2019-02-26 DIAGNOSIS — I10 Essential (primary) hypertension: Secondary | ICD-10-CM

## 2019-02-26 MED ORDER — HYDROCHLOROTHIAZIDE 12.5 MG PO CAPS: 12.5000 mg | ORAL_CAPSULE | Freq: Every day | ORAL | 0 refills | Status: DC

## 2019-02-26 NOTE — Telephone Encounter (Signed)
Contacted patient to discuss AVS Instructions. Gave patient Luke's recommendations from today's virtual office visit. Discussed med changes and scheduled follow up appt with Dr Claiborne Billings. Patient voiced understanding and AVS mailed.

## 2019-02-26 NOTE — Patient Instructions (Addendum)
Medication Instructions:  START Hydrocholorothiazide 12.5mg  Take 1 tablet daily every morning. Once you run out of this medication and its time for a refill contact office for the combination tablet of Valsartan/Hctz *If you need a refill on your cardiac medications before your next appointment, please call your pharmacy*  Lab Work: Your physician recommends that you return for lab work in: 1 month BMET  If you have labs (blood work) drawn today and your tests are completely normal, you will receive your results only by: Marland Kitchen MyChart Message (if you have MyChart) OR . A paper copy in the mail If you have any lab test that is abnormal or we need to change your treatment, we will call you to review the results.  Testing/Procedures: None   Follow-Up: At [facility], you and your health needs are our priority.  As part of our continuing mission to provide you with exceptional heart care, we have created designated Provider Care Teams.  These Care Teams include your primary Cardiologist (physician) and Advanced Practice Providers (APPs -  Physician Assistants and Nurse Practitioners) who all work together to provide you with the care you need, when you need it.  Your next appointment:   3 months  The format for your next appointment:   In Person  Provider:   Shelva Majestic, MD  Other Instructions

## 2019-02-26 NOTE — Progress Notes (Signed)
Virtual Visit via Telephone Note   This visit type was conducted due to national recommendations for restrictions regarding the COVID-19 Pandemic (e.g. social distancing) in an effort to limit this patient's exposure and mitigate transmission in our community.  Due to her co-morbid illnesses, this patient is at least at moderate risk for complications without adequate follow up.  This format is felt to be most appropriate for this patient at this time.  The patient did not have access to video technology/had technical difficulties with video requiring transitioning to audio format only (telephone).  All issues noted in this document were discussed and addressed.  No physical exam could be performed with this format.  Please refer to the patient's chart for her  consent to telehealth for [facility].   Date:  02/26/2019   ID:  Barbara Romero, DOB [birth year], MRN [id number]  Patient Location: Home Provider Location: Home  PCP:  Thomasene Lot, DO  Cardiologist:  Dr Tresa Endo  Electrophysiologist:  None   Evaluation Performed:  Follow-Up Visit  Chief Complaint:  none  History of Present Illness:    Barbara Romero is a 38 y.o. female with a history of obesity, sleep apnea on CPAP, smoking, and hypertension.  I saw her in the office 02/12/2019 to get her medications refilled.  She had no history of chest pain or dyspnea.  She walks 30 minutes a day.  She lives at home with her parents and works in Clinical biochemist virtually.  When I saw her in the office she had a temperature of 101.  We suggested COVID testing which came back negative. Her fever resolved within 24 hours.  The patient is contacted today for follow-up.  I had her monitor her blood pressures at home.  Over 20 readings more than half were greater than 130-140 systolic.  I reviewed her echocardiogram from 2017 and she did have mild LVH.  I suggested we add HCTZ 12.5 mg daily to her valsartan 160 mg.  She just refilled her valsartan  prescription for 90 days so when that runs out we can switch her to valsartan HCTZ combination.  I suggested she have a BMP in 1 month and see Dr. Tresa Endo in follow-up in 3 months.  The patient does not have symptoms concerning for COVID-19 infection (fever, chills, cough, or new shortness of breath).    Past Medical History:  Diagnosis Date  . GERD (gastroesophageal reflux disease)   . Hypertension    Past Surgical History:  Procedure Laterality Date  . BACK SURGERY     L5     Current Meds  Medication Sig  . cholecalciferol (VITAMIN D) 1000 units tablet Take 2,000 Units by mouth daily.  . valsartan (DIOVAN) 160 MG tablet Take 1 tablet (160 mg total) by mouth daily.     Allergies:   Hydrocodone   Social History   Tobacco Use  . Smoking status: Current Every Day Smoker    Packs/day: 1.00    Years: 23.00    Pack years: 23.00    Types: Cigarettes  . Smokeless tobacco: Never Used  Substance Use Topics  . Alcohol use: Yes    Comment: social  . Drug use: No     Family Hx: The patient's family history includes Cancer in her father; Hypertension in her father.  ROS:   Please see the history of present illness.    All other systems reviewed and are negative.   Prior CV studies:   The following studies  were reviewed today:  Echo 2017  Labs/Other Tests and Data Reviewed:    EKG:  No ECG reviewed.  Recent Labs: 02/12/2019: BUN 12; Creatinine, Ser 0.62; Hemoglobin 16.2; Platelets 257; Potassium 4.2; Sodium 136   Recent Lipid Panel Lab Results  Component Value Date/Time   CHOL 158 02/02/2017 10:24 AM   TRIG 61 02/02/2017 10:24 AM   HDL 42 02/02/2017 10:24 AM   CHOLHDL 3.8 02/02/2017 10:24 AM   CHOLHDL 4.2 03/17/2016 08:56 AM   LDLCALC 104 (H) 02/02/2017 10:24 AM    Wt Readings from Last 3 Encounters:  02/26/19 273 lb (123.8 kg)  02/12/19 276 lb 3.2 oz (125.3 kg)  06/15/17 289 lb 3.2 oz (131.2 kg)     Objective:    Vital Signs:  BP 139/77   Pulse 92   Ht  5\' 6"  (1.676 m)   Wt 273 lb (123.8 kg)   BMI 44.06 kg/m    VITAL SIGNS:  reviewed  ASSESSMENT & PLAN:    Essential hypertension B/P not at goal- add HCTZ 12.5   OSA (obstructive sleep apnea) She reports compliance with C-pap  Tobacco use disorder 1 PPD  Morbid obesity BMI 44- she has actually lost > 25 lbs over the past year  COVID-19 Education: The signs and symptoms of COVID-19 were discussed with the patient and how to seek care for testing (follow up with PCP or arrange E-visit). The importance of social distancing was discussed today.  Time:   Today, I have spent 15 minutes with the patient with telehealth technology discussing the above problems.     Medication Adjustments/Labs and Tests Ordered: Current medicines are reviewed at length with the patient today.  Concerns regarding medicines are outlined above.   Tests Ordered: No orders of the defined types were placed in this encounter.   Medication Changes: No orders of the defined types were placed in this encounter.   Follow Up:  In Person BMP in one month- Dr Claiborne Billings in 3 months  Signed, Kerin Ransom, PA-C  02/26/2019 11:13 AM    [place]

## 2019-02-27 ENCOUNTER — Other Ambulatory Visit: Payer: Self-pay

## 2019-02-27 DIAGNOSIS — I10 Essential (primary) hypertension: Secondary | ICD-10-CM

## 2019-03-19 ENCOUNTER — Telehealth: Payer: Self-pay

## 2019-03-19 NOTE — Telephone Encounter (Signed)
Left message on patients machine to make sure her form was received that we completed and faxed over. Copy of form will be scanned in to chart. Advised patient to contact office if the form has not been recieved.

## 2019-04-28 MED ORDER — VALSARTAN-HYDROCHLOROTHIAZIDE 160-12.5 MG PO TABS: 1.0000 | ORAL_TABLET | Freq: Every day | ORAL | 6 refills | Status: DC

## 2019-04-29 NOTE — Addendum Note (Signed)
Addended by: Alyson Ingles on: 04/29/2019 10:37 AM   Modules accepted: Orders

## 2019-05-20 ENCOUNTER — Other Ambulatory Visit: Payer: Self-pay | Admitting: Cardiology

## 2019-05-27 ENCOUNTER — Ambulatory Visit: Payer: BC Managed Care – PPO | Admitting: Cardiovascular Disease

## 2019-08-01 ENCOUNTER — Ambulatory Visit: Payer: BC Managed Care – PPO | Admitting: Cardiovascular Disease

## 2019-08-21 ENCOUNTER — Encounter: Payer: Self-pay | Admitting: Cardiovascular Disease

## 2019-08-21 ENCOUNTER — Telehealth (INDEPENDENT_AMBULATORY_CARE_PROVIDER_SITE_OTHER): Payer: BC Managed Care – PPO | Admitting: Cardiovascular Disease

## 2019-08-21 VITALS — BP 125/81 | HR 92 | Ht 66.0 in | Wt 276.0 lb

## 2019-08-21 DIAGNOSIS — G4733 Obstructive sleep apnea (adult) (pediatric): Secondary | ICD-10-CM

## 2019-08-21 DIAGNOSIS — F17200 Nicotine dependence, unspecified, uncomplicated: Secondary | ICD-10-CM

## 2019-08-21 DIAGNOSIS — I10 Essential (primary) hypertension: Secondary | ICD-10-CM | POA: Diagnosis not present

## 2019-08-21 DIAGNOSIS — R509 Fever, unspecified: Secondary | ICD-10-CM

## 2019-08-21 NOTE — Patient Instructions (Signed)
Medication Instructions:  CONTINUE WITH CURRENT MEDICATIONS. NO CHANGES.  *If you need a refill on your cardiac medications before your next appointment, please call your pharmacy*   Lab Work: THIS MONTH, FASTING LAB WORK: CMET, LIPID, TSH  If you have labs (blood work) drawn today and your tests are completely normal, you will receive your results only by: Marland Kitchen MyChart Message (if you have MyChart) OR . A paper copy in the mail If you have any lab test that is abnormal or we need to change your treatment, we will call you to review the results.  Follow-Up: At [facility], you and your health needs are our priority.  As part of our continuing mission to provide you with exceptional heart care, we have created designated Provider Care Teams.  These Care Teams include your primary Cardiologist (physician) and Advanced Practice Providers (APPs -  Physician Assistants and Nurse Practitioners) who all work together to provide you with the care you need, when you need it.  We recommend signing up for the patient portal called "MyChart".  Sign up information is provided on this After Visit Summary.  MyChart is used to connect with patients for Virtual Visits (Telemedicine).  Patients are able to view lab/test results, encounter notes, upcoming appointments, etc.  Non-urgent messages can be sent to your provider as well.   To learn more about what you can do with MyChart, go to [email or web address].    Your next appointment:   12 month(s)  The format for your next appointment:   In Person  Provider:   Nicki Guadal[place], MD

## 2019-08-21 NOTE — Progress Notes (Signed)
Virtual Visit via Telephone Note   This visit type was conducted due to national recommendations for restrictions regarding the COVID-19 Pandemic (e.g. social distancing) in an effort to limit this patient's exposure and mitigate transmission in our community.  Due to her co-morbid illnesses, this patient is at least at moderate risk for complications without adequate follow up.  This format is felt to be most appropriate for this patient at this time.  The patient did not have access to video technology/had technical difficulties with video requiring transitioning to audio format only (telephone).  All issues noted in this document were discussed and addressed.  No physical exam could be performed with this format.  Please refer to the patient's chart for her  consent to telehealth for [facility]   The patient was identified using 2 identifiers.  Date:  08/21/2019   ID:  [name], DOB [birth year], MRN [id number]  Patient Location: Home Provider Location: Home  PCP:  No primary care provider on file.  Cardiologist:  Nicki Guadalajara, MD Electrophysiologist:  None   Evaluation Performed:  Follow-Up Visit  Chief Complaint:  31 month F/U with me  History of Present Illness:    DORTHIA TOUT is a 39 y.o. female  who was referred through the courtesy of Dr. Thomasene Lot for sleep evaluation and hypertension. I saw her initially in December 2017 and last evaluated her in October 2018.  She presents for follow-up evaluation.  Ms. Shumard has a long-standing history of morbid obesity, and while she was living in [place], [place] in 2002 was scheduled to undergo a sleep study.  However, on the night of her planned sleep study, it had snowed in her study was canceled.  She never followed up with this and never had subsequent evaluation.  She now lives in the [place] area at home with her Jacky Kindle.  Over the past 15 years she  continued to have difficulty with sleep.  Recently,  she has been noted to have very loud snoring as well as witnessed apnea.  She often wakes up 3-4 times per night and has frequent nocturia.  Typically she goes to bed between 1 AM and 2 AM and wakes up at 9 AM.  Her sleep is nonrestorative.  Over the last several years.  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  She does not exercise.  She has been smoking at least one pack per day for 22 years.  She has noticed progressive weight gain.  She was  evaluated by Dr Sharee Holster and was felt to have a generalized anxiety disorder and was given a prescription for Prozac.  During her evaluation, she had diastolic hypertension with a blood pressure of 134/92.  At that time she weighed 302 pounds and BMI was 48.06 kg/m.  She was referred for evaluation.  During her initial evaluation, her Bang score was 6/8.  An Epworth Sleepiness Scale score was calculated in the office and this endorsed that 6 with a moderate chance of dozing while sitting and reading, a high chance of dozing while lying down to rest in the afternoon when circumstances permit, and a slight chance of dozing while watching television.  When I saw [name], I felt she had symptoms very highly suggestive of obstructive sleep apnea.  She also was hypertensive.  I scheduled her for an echo Doppler study to evaluate for hypertensive heart disease.  This was done on 04/04/2016 and showed an EF of 60-65%.  Diastolic parameters were normal.  There was mild LVH.  There is no significant valvular abnormalities.  I referred her for sleep study which was done on 04/04/2016.  This revealed very severe sleep apnea with an HI of 105.7 per hour.  She was unable to have any rim sleep.  There was loud snoring.  Her oxygen desaturation nadir was 84%.  The patient became very anxious when CPAP was attempted and as result, the PSG study was resumed.  I recommended CPAP desensitization or  Evaluation which she had done.  Subsequently she received an auto Pap unit in late March and has an air since 10 set.  AutoSet CPAP.  She feels significantly improved since initiating CPAP therapy.  I obtained a download in the office today from September 19 through 02/01/2017, which shows excellent compliance.  She is averaging almost 7 hours and 30 minutes of sleep per night.  Her AHI is 0.9 per hour.  95th percentile, average pressures 13.9 and maximum average pressure 16.3.  Previously she was getting up 3-4 times per night for urination.  She now almost sleeps through the entire night were only gets up once.  An Epworth Sleepiness Scale score was recalculated in the office  and this endorsed at 4.  DME company is [facility].  During her initial evaluation, I adjusted her CPAP auto unit to minimum pressure at 8 up to maximum of 20 reduced her ramp time to 5 minutes.  Initially she was treated with valsartan for hypertension but was switched irbesartan as result of the valsartan impurity recognition.    Since I saw her in October 2018 she was evaluated by Kerin Ransom in November 2020. During his evaluation she was hypertensive and febrile.  She had lost 25 pounds the preceding year.  Presently, the patient has been on valsartan HCT 160/12.5 mg.  She states her blood pressure has been stable.  She has lost additional 14 pounds.  She typically walks 30 minutes a day as well as does gardening.  She has continued to use CPAP therapy.  Download was obtained from July 22, 2019 through Aug 20, 2019.  She is 100% compliance.  Average usage is 7 hours and 41 minutes.  AHI is excellent at 0.7/h.  Her 95th percentile pressure is 14.6 with maximum average pressure 16.6 cm of water.  She presents for evaluation.  The patient does not have symptoms concerning for COVID-19 infection (fever, chills, cough, or new shortness of breath).    Past Medical History:  Diagnosis Date  . GERD (gastroesophageal reflux  disease)   . Hypertension    Past Surgical History:  Procedure Laterality Date  . BACK SURGERY     L5     Current Meds  Medication Sig  . cholecalciferol (VITAMIN D) 1000 units tablet Take 2,000 Units by mouth daily.  . valsartan-hydrochlorothiazide (DIOVAN HCT) 160-12.5 MG tablet Take 1 tablet by mouth daily.     Allergies:   Hydrocodone   Social History   Tobacco Use  . Smoking status: Current Every Day Smoker    Packs/day: 1.00    Years: 23.00    Pack years: 23.00    Types: Cigarettes  . Smokeless tobacco: Never Used  Substance Use Topics  . Alcohol use: Yes    Comment: social  . Drug use: No     Family Hx: The patient's family history includes Cancer in her father; Hypertension  in her father.  ROS:   Please see the history of present illness.    No fevers chills night sweats No cough purposeful weight loss No wheezing No chest pain No awareness of palpitations Sleeping well with CPAP.  All other systems reviewed and are negative.   Prior CV studies:   The following studies were reviewed today:  N/A  Labs/Other Tests and Data Reviewed:    EKG: I reviewed her most recent ECG from February 13, 2019 which shows normal sinus rhythm at 90 bpm.  No ectopy.  Normal intervals.  Recent Labs: 02/12/2019: BUN 12; Creatinine, Ser 0.62; Hemoglobin 16.2; Platelets 257; Potassium 4.2; Sodium 136   Recent Lipid Panel Lab Results  Component Value Date/Time   CHOL 158 02/02/2017 10:24 AM   TRIG 61 02/02/2017 10:24 AM   HDL 42 02/02/2017 10:24 AM   CHOLHDL 3.8 02/02/2017 10:24 AM   CHOLHDL 4.2 03/17/2016 08:56 AM   LDLCALC 104 (H) 02/02/2017 10:24 AM    Wt Readings from Last 3 Encounters:  08/21/19 276 lb (125.2 kg)  02/26/19 273 lb (123.8 kg)  02/12/19 276 lb 3.2 oz (125.3 kg)     Objective:    Vital Signs:  BP 125/81   Pulse 92   Ht 5\' 6"  (1.676 m)   Wt 276 lb (125.2 kg)   BMI 44.55 kg/m    Since this was a virtual visit I could not physically  examine the patient. Breathing was normal and not labored There was no audible wheezing Palpation of her pulse normal rhythm There was no chest pain or shortness of breath. She denies significant swelling No neurologic symptoms Normal affect and mood  ASSESSMENT & PLAN:    1. Essential hypertension: Blood pressure today is stable on her valsartan HCT 160/25.  She has not had recent laboratory which will be recommended. 2. Severe obstructive sleep apnea: Initial AHI was 105/h; she continues to be 100% compliant on CPAP therapy.  AHI is excellent at 0.7/h with 95th percentile pressure 14.6/h with maximum average pressure 16.6/h.  She is averaging 7 hours and 40 minutes of CPAP use per night.  She is unaware of breakthrough snoring, there is no residual daytime sleepiness. 3. Morbid obesity.  She has been successful with weight loss.  Her BMI is still significantly increased at 44.55 kg/m.  She is now walking 30 minutes/day. 4. History of mild hyperlipidemia: We will reassess.  COVID-19 Education: The signs and symptoms of COVID-19 were discussed with the patient and how to seek care for testing (follow up with PCP or arrange E-visit).  The importance of social distancing was discussed today.  Time:   Today, I have spent 20 minutes with the patient with telehealth technology discussing the above problems.     Medication Adjustments/Labs and Tests Ordered: Current medicines are reviewed at length with the patient today.  Concerns regarding medicines are outlined above.   Tests Ordered: No orders of the defined types were placed in this encounter.   Medication Changes: No orders of the defined types were placed in this encounter.   Follow Up: We will schedule the patient to undergo a comprehensive metabolic panel, lipid studies and TSH.  In office follow-up evaluation in 1 year  Signed, , MD  08/21/2019 8:22 AM    [facility]

## 2019-08-23 ENCOUNTER — Encounter: Payer: Self-pay | Admitting: Cardiovascular Disease

## 2019-09-03 DIAGNOSIS — R509 Fever, unspecified: Secondary | ICD-10-CM | POA: Diagnosis not present

## 2019-09-03 DIAGNOSIS — F17200 Nicotine dependence, unspecified, uncomplicated: Secondary | ICD-10-CM | POA: Diagnosis not present

## 2019-09-03 DIAGNOSIS — G4733 Obstructive sleep apnea (adult) (pediatric): Secondary | ICD-10-CM | POA: Diagnosis not present

## 2019-09-03 DIAGNOSIS — I10 Essential (primary) hypertension: Secondary | ICD-10-CM | POA: Diagnosis not present

## 2019-09-03 LAB — COMPREHENSIVE METABOLIC PANEL
ALT: 12 IU/L (ref 0–32)
AST: 14 IU/L (ref 0–40)
Albumin/Globulin Ratio: 1.6 (ref 1.2–2.2)
Albumin: 4.2 g/dL (ref 3.8–4.8)
Alkaline Phosphatase: 52 IU/L (ref 48–121)
BUN/Creatinine Ratio: 19 (ref 9–23)
BUN: 14 mg/dL (ref 6–24)
Bilirubin Total: 0.5 mg/dL (ref 0.0–1.2)
CO2: 22 mmol/L (ref 20–29)
Calcium: 9.2 mg/dL (ref 8.7–10.2)
Chloride: 102 mmol/L (ref 96–106)
Creatinine, Ser: 0.73 mg/dL (ref 0.57–1.00)
GFR calc Af Amer: 119 mL/min/{1.73_m2} (ref >59)
GFR calc non Af Amer: 103 mL/min/{1.73_m2} (ref >59)
Globulin, Total: 2.7 g/dL (ref 1.5–4.5)
Glucose: 83 mg/dL (ref 65–99)
Potassium: 4.3 mmol/L (ref 3.5–5.2)
Sodium: 137 mmol/L (ref 134–144)
Total Protein: 6.9 g/dL (ref 6.0–8.5)

## 2019-09-03 LAB — LIPID PANEL
Chol/HDL Ratio: 3.8 ratio (ref 0.0–4.4)
Cholesterol, Total: 190 mg/dL (ref 100–199)
HDL: 50 mg/dL (ref >39)
LDL Chol Calc (NIH): 122 mg/dL — ABNORMAL HIGH (ref 0–99)
Triglycerides: 100 mg/dL (ref 0–149)
VLDL Cholesterol Cal: 18 mg/dL (ref 5–40)

## 2019-09-03 LAB — TSH: TSH: 1.39 u[IU]/mL (ref 0.450–4.500)

## 2019-10-30 ENCOUNTER — Other Ambulatory Visit: Payer: Self-pay

## 2019-10-30 ENCOUNTER — Other Ambulatory Visit: Payer: Self-pay | Admitting: Cardiology

## 2019-10-30 DIAGNOSIS — Z79899 Other long term (current) drug therapy: Secondary | ICD-10-CM

## 2020-07-11 ENCOUNTER — Other Ambulatory Visit: Payer: Self-pay | Admitting: Cardiovascular Disease

## 2020-07-20 ENCOUNTER — Encounter: Payer: Self-pay | Admitting: Cardiovascular Disease

## 2020-07-20 ENCOUNTER — Ambulatory Visit (INDEPENDENT_AMBULATORY_CARE_PROVIDER_SITE_OTHER): Payer: BC Managed Care – PPO | Admitting: Cardiovascular Disease

## 2020-07-20 ENCOUNTER — Other Ambulatory Visit: Payer: Self-pay

## 2020-07-20 DIAGNOSIS — Z79899 Other long term (current) drug therapy: Secondary | ICD-10-CM

## 2020-07-20 DIAGNOSIS — I10 Essential (primary) hypertension: Secondary | ICD-10-CM | POA: Diagnosis not present

## 2020-07-20 DIAGNOSIS — G4733 Obstructive sleep apnea (adult) (pediatric): Secondary | ICD-10-CM

## 2020-07-20 MED ORDER — HYDROCHLOROTHIAZIDE 25 MG PO TABS: 12.5000 mg | ORAL_TABLET | ORAL | 2 refills | Status: DC | PRN

## 2020-07-20 NOTE — Patient Instructions (Signed)
Medication Instructions:  Add HCTZ 12.5 mg as needed on days when you notice increased swelling.  *If you need a refill on your cardiac medications before your next appointment, please call your pharmacy*   Lab Work: Fasting lab work (CBC, CMET, TSH, LIPID, A1C)  If you have labs (blood work) drawn today and your tests are completely normal, you will receive your results only by: Marland Kitchen MyChart Message (if you have MyChart) OR . A paper copy in the mail If you have any lab test that is abnormal or we need to change your treatment, we will call you to review the results.   Follow-Up: At [facility], you and your health needs are our priority.  As part of our continuing mission to provide you with exceptional heart care, we have created designated Provider Care Teams.  These Care Teams include your primary Cardiologist (physician) and Advanced Practice Providers (APPs -  Physician Assistants and Nurse Practitioners) who all work together to provide you with the care you need, when you need it.  We recommend signing up for the patient portal called "MyChart".  Sign up information is provided on this After Visit Summary.  MyChart is used to connect with patients for Virtual Visits (Telemedicine).  Patients are able to view lab/test results, encounter notes, upcoming appointments, etc.  Non-urgent messages can be sent to your provider as well.   To learn more about what you can do with MyChart, go to [email or web address].    Your next appointment:   12 month(s)  The format for your next appointment:   In Person  Provider:   Nicki Guadalajara, MD

## 2020-07-20 NOTE — Progress Notes (Signed)
Cardiology Office Note    Date:  07/22/2020   ID:  Barbara Romero, DOB [birth year], MRN [id number]  PCP:  Merryl Hacker, No  Cardiologist:  Shelva Majestic, MD     History of Present Illness:  Barbara Romero is a 40 y.o. female whowasreferred through the courtesy of Dr. Mellody Dance for sleep evaluation and hypertension. I saw her initially in December 2017 and last evaluated her in May 2021  She presents for follow-up evaluation.  [name] has a long-standing history of morbid obesity, and while she was living in [place], [place] in 2002 was scheduled to undergo a sleep study. However, on the night of her planned sleep study, it had snowed in her study was canceled. She never followed up with this and never had subsequent evaluation. She now lives in the [place] area at home with her Barbara Romero. Over the past 15 years shecontinued to have difficulty with sleep. Recently, she has been noted to have very loud snoring as well as witnessed apnea. She often wakes up 3-4 times per night and has frequent nocturia. Typically she goes to bed between 1 AM and 2 AM and wakes up at 9 AM. Her sleep is nonrestorative. Over the last several years. 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 She does not exercise. She has been smoking at least one pack per day for 22 years. She has noticed progressive weight gain.  She was evaluated by Dr Raliegh Scarlet and was felt to have a generalized anxiety disorder and was given a prescription for Prozac. During her evaluation, she had diastolic hypertension with a blood pressure of 134/92. At that time she weighed 302 pounds and BMI was 48.06 kg/m. She wasreferred for evaluation.  During her initial evaluation, her Bang score was 6/8. An Epworth Sleepiness Scale score was calculated in the office and this endorsed that 6 with a moderate chance of dozing while sitting and  reading, a high chance of dozing while lying down to rest in the afternoon when circumstances permit, and a slight chance of dozing while watching television.  When I saw [name], I felt she had symptoms very highly suggestive of obstructive sleep apnea. She also was hypertensive.I scheduled her for an echo Doppler study to evaluate for hypertensive heart disease. This was done on 04/04/2016 and showed an EF of 60-65%. Diastolic parameters were normal. There was mild LVH. There is no significant valvular abnormalities. I referred her for sleep study which was done on 04/04/2016. This revealed very severe sleep apnea with an HI of 105.7 per hour. She was unable to have any rim sleep. There was loud snoring. Her oxygen desaturation nadir was 84%. The patient became very anxious when CPAP was attempted and as result, the PSG study was resumed. I recommended CPAP desensitization or Evaluation which she had done. Subsequently she received an auto Pap unit in late March and has an air since 10 set. AutoSet CPAP. She feels significantly improved since initiating CPAP therapy. I obtained a download in the office today from September 19 through 02/01/2017, which shows excellent compliance. She is averaging almost 7 hours and 30 minutes of sleep per night. Her AHI is 0.9 per hour. 95th percentile, average pressures 13.9 and maximum average pressure 16.3. Previously she was getting up 3-4 times per night for urination. She now almost sleeps through the entire night were only gets up once. An  Epworth Sleepiness Scale score was recalculated in the office  and this endorsed at 4. DME company is [facility].  During her initial evaluation, I adjusted her CPAP auto unit to minimum pressure at 8 up to maximum of 20 reduced her ramp time to 5 minutes.  Initially she was treated with valsartan for hypertension but was switched irbesartan as result of the valsartan impurity recognition.    She was evaluated by Kerin Ransom in November 2020. During his evaluation she was hypertensive and febrile.  She had lost 25 pounds the preceding year.  I last saw her in a telemedicine encounter on Aug 21, 2019.  At that time she was on valsartan HCT 160/12.5 mg and her blood pressure remained stable.  She had  lost additional 14 pounds.  She typically walks 30 minutes a day as well as does gardening.  She has continued to use CPAP therapy.  A download was obtained from July 22, 2019 through Aug 20, 2019.  She is 100% compliance.  Average usage is 7 hours and 41 minutes.  AHI is excellent at 0.7/h.  Her 95th percentile pressure is 14.6 with maximum average pressure 16.6 cm of water.    Last saw her, she admits to being more active.  At times she does experience some mild leg discomfort while doing gardening and some mild leg swelling.  She denies any chest pain.  She has continued to use CPAP therapy and I obtained a download today from June 20, 2020 through July 19, 2020 which shows 100% compliance.  AHI is excellent at 0.6 and her 95th percentile pressure is 15.4 with a maximum average pressure of 17.2.  Her AutoPap settings were range of 8 to 20 cm.  Her ramp was starting at 4 cm.  She presents for evaluation    Past Medical History:  Diagnosis Date  . GERD (gastroesophageal reflux disease)   . Hypertension     Past Surgical History:  Procedure Laterality Date  . BACK SURGERY     L5    Current Medications: Outpatient Medications Prior to Visit  Medication Sig Dispense Refill  . cholecalciferol (VITAMIN D) 1000 units tablet Take 2,000 Units by mouth daily.    . valsartan-hydrochlorothiazide (DIOVAN-HCT) 160-12.5 MG tablet TAKE 1 TABLET BY MOUTH EVERY DAY 90 tablet 1   No facility-administered medications prior to visit.     Allergies:   Hydrocodone   Social History   Socioeconomic History  . Marital status: Single    Spouse name: Not on file  . Number of children: Not on  file  . Years of education: Not on file  . Highest education level: Not on file  Occupational History  . Not on file  Tobacco Use  . Smoking status: Current Every Day Smoker    Packs/day: 1.00    Years: 23.00    Pack years: 23.00    Types: Cigarettes  . Smokeless tobacco: Never Used  Substance and Sexual Activity  . Alcohol use: Yes    Comment: social  . Drug use: No  . Sexual activity: Not Currently    Birth control/protection: None  Other Topics Concern  . Not on file  Social History Narrative  . Not on file   Social Determinants of Health   Financial Resource Strain: Not on file  Food Insecurity: Not on file  Transportation Needs: Not on file  Physical Activity: Not on file  Stress: Not on file  Social Connections: Not on file  Family History:  The patient's family history includes Cancer in her father; Hypertension in her father.   ROS General: Negative; No fevers, chills, or night sweats;  HEENT: Negative; No changes in vision or hearing, sinus congestion, difficulty swallowing Pulmonary: Negative; No cough, wheezing, shortness of breath, hemoptysis Cardiovascular: Negative; No chest pain, presyncope, syncope, palpitations GI: Negative; No nausea, vomiting, diarrhea, or abdominal pain GU: Negative; No dysuria, hematuria, or difficulty voiding Musculoskeletal: Negative; no myalgias, joint pain, or weakness Hematologic/Oncology: Negative; no easy bruising, bleeding Endocrine: Negative; no heat/cold intolerance; no diabetes Neuro: Negative; no changes in balance, headaches Skin: Negative; No rashes or skin lesions Psychiatric: Negative; No behavioral problems, depression Sleep: Negative; No snoring, daytime sleepiness, hypersomnolence, bruxism, restless legs, hypnogognic hallucinations, no cataplexy Other comprehensive 14 point system review is negative.   PHYSICAL EXAM:   VS:  BP 130/82   Pulse 82   Ht '5\' 6"'  (1.676 m)   Wt 273 lb 6.4 oz (124 kg)   SpO2  99%   BMI 44.13 kg/m     Repeat blood pressure by me was 130/82  Wt Readings from Last 3 Encounters:  07/20/20 273 lb 6.4 oz (124 kg)  08/21/19 276 lb (125.2 kg)  02/26/19 273 lb (123.8 kg)    General: Alert, oriented, no distress.  Skin: normal turgor, no rashes, warm and dry HEENT: Normocephalic, atraumatic. Pupils equal round and reactive to light; sclera anicteric; extraocular muscles intact;  Nose without nasal septal hypertrophy Mouth/Parynx benign; Mallinpatti scale 3 Neck: No JVD, no carotid bruits; normal carotid upstroke Lungs: clear to ausculatation and percussion; no wheezing or rales Chest wall: without tenderness to palpitation Heart: PMI not displaced, RRR, s1 s2 normal, 1/6 systolic murmur, no diastolic murmur, no rubs, gallops, thrills, or heaves Abdomen: soft, nontender; no hepatosplenomehaly, BS+; abdominal aorta nontender and not dilated by palpation. Back: no CVA tenderness Pulses 2+ Musculoskeletal: full range of motion, normal strength, no joint deformities Extremities: no clubbing cyanosis or edema, Homan's sign negative  Neurologic: grossly nonfocal; Cranial nerves grossly wnl Psychologic: Normal mood and affect   Studies/Labs Reviewed:   EKG:  EKG is ordered today.  ECG (independently read by me): NSR at 82, no ectopy  February 13, 2019  ECG (independently read by me): NSR at 90,no ectopy,normal intervals  Recent Labs: BMP Latest Ref Rng & Units 07/20/2020 09/03/2019 02/12/2019  Glucose 65 - 99 mg/dL 85 83 84  BUN 6 - 24 mg/dL '18 14 12  ' Creatinine 0.57 - 1.00 mg/dL 0.79 0.73 0.62  BUN/Creat Ratio 9 - '23 23 19 19  ' Sodium 134 - 144 mmol/L 137 137 136  Potassium 3.5 - 5.2 mmol/L 4.4 4.3 4.2  Chloride 96 - 106 mmol/L 102 102 103  CO2 20 - 29 mmol/L 22 22 19(L)  Calcium 8.7 - 10.2 mg/dL 9.3 9.2 9.4     Hepatic Function Latest Ref Rng & Units 07/20/2020 09/03/2019 02/02/2017  Total Protein 6.0 - 8.5 g/dL 7.3 6.9 7.2  Albumin 3.8 - 4.8 g/dL 4.3 4.2 4.3   AST 0 - 40 IU/L '17 14 16  ' ALT 0 - 32 IU/L '12 12 10  ' Alk Phosphatase 44 - 121 IU/L 63 52 71  Total Bilirubin 0.0 - 1.2 mg/dL 0.3 0.5 0.4  Bilirubin, Direct - - - -    CBC Latest Ref Rng & Units 07/20/2020 02/12/2019 02/02/2017  WBC 3.4 - 10.8 x10E3/uL 8.7 10.8 8.6  Hemoglobin 11.1 - 15.9 g/dL 15.5 16.2(H) 14.6  Hematocrit 34.0 - 46.6 %  45.4 46.1 43.9  Platelets 150 - 450 x10E3/uL 257 257 268   Lab Results  Component Value Date   MCV 90 07/20/2020   MCV 91 02/12/2019   MCV 88 02/02/2017   Lab Results  Component Value Date   TSH 1.410 07/20/2020   Lab Results  Component Value Date   HGBA1C 5.4 07/20/2020     BNP No results found for: BNP  ProBNP No results found for: PROBNP   Lipid Panel     Component Value Date/Time   CHOL 193 07/20/2020 0842   TRIG 88 07/20/2020 0842   HDL 51 07/20/2020 0842   CHOLHDL 3.8 07/20/2020 0842   CHOLHDL 4.2 03/17/2016 0856   VLDL 18 03/17/2016 0856   LDLCALC 126 (H) 07/20/2020 0842   LABVLDL 16 07/20/2020 0842     RADIOLOGY: No results found.   Additional studies/ records that were reviewed today include:  A new download was obtained from March 6 through July 19, 2020.  Usage days 100%, average usage 6 hours 53 minutes; AutoSet pressure range 8 to 20 cm, AHI 0.6/h, no leak.  95th percentile pressure 15.4 maximum average pressure 17.2  ASSESSMENT:    1. Essential hypertension   2. OSA (obstructive sleep apnea)   3. Morbid obesity ([place])   4. Medication management     PLAN:  1.  Severe obstructive sleep apnea: Patient continues to use CPAP with 100% compliance which average use just shy of 7 hours per night.  I reviewed her most recent download with her in detail.  AHI is excellent.  With her 95th percentile pressure at 15.4 and maximum average pressure at 17.4, I will change her auto settings from 8-20 to 10 to 20 cm of water.  I am also increasing her ramp start pressure at 6.  Most to bed between 11 PM and midnight and wakes  up between 630 and 7 AM.  She is sleeping well.  There is no breakthrough snoring.  Her sleep is restorative.  An Epworth Sleepiness Scale score was calculated in the office today and this endorsed at 4.  2.  Essential hypertension: Blood pressure today is stable 130/80 she continues to be on valsartan/HCTZ 160/12.5 mg daily.  If she does have remittent leg swelling, I have recommended a as needed extra HCTZ 12.5 mg.  3.  Morbid obesity: BMI continues to be consistent with morbid obesity but she has lost weight over the last several years.  She is now becoming more active and has been using a stationary bike and also has begun doing gardening work again.  4.  Mild hyperlipidemia: Lipid study in 2021 showed total cholesterol 190, HDL 50, triglycerides 100, LDL 122.  Discussed diet in particular a heart healthy Mediterranean diet.   Medication Adjustments/Labs and Tests Ordered: Current medicines are reviewed at length with the patient today.  Concerns regarding medicines are outlined above.  Medication changes, Labs and Tests ordered today are listed in the Patient Instructions below. Patient Instructions  Medication Instructions:  Add HCTZ 12.5 mg as needed on days when you notice increased swelling.  *If you need a refill on your cardiac medications before your next appointment, please call your pharmacy*   Lab Work: Fasting lab work (CBC, CMET, TSH, LIPID, A1C)  If you have labs (blood work) drawn today and your tests are completely normal, you will receive your results only by: Marland Kitchen MyChart Message (if you have MyChart) OR . A paper copy in the mail If you  have any lab test that is abnormal or we need to change your treatment, we will call you to review the results.   Follow-Up: At [facility], you and your health needs are our priority.  As part of our continuing mission to provide you with exceptional heart care, we have created designated Provider Care Teams.  These Care Teams  include your primary Cardiologist (physician) and Advanced Practice Providers (APPs -  Physician Assistants and Nurse Practitioners) who all work together to provide you with the care you need, when you need it.  We recommend signing up for the patient portal called "MyChart".  Sign up information is provided on this After Visit Summary.  MyChart is used to connect with patients for Virtual Visits (Telemedicine).  Patients are able to view lab/test results, encounter notes, upcoming appointments, etc.  Non-urgent messages can be sent to your provider as well.   To learn more about what you can do with MyChart, go to [email or web address].    Your next appointment:   12 month(s)  The format for your next appointment:   In Person  Provider:   Shelva Majestic, MD        Signed, Shelva Majestic, MD  07/22/2020 4:42 PM    [facility] [address] [place], [place], [place]  [address] Phone: [phone]

## 2020-07-21 LAB — HEMOGLOBIN A1C
Est. average glucose Bld gHb Est-mCnc: 108 mg/dL
Hgb A1c MFr Bld: 5.4 % (ref 4.8–5.6)

## 2020-07-21 LAB — COMPREHENSIVE METABOLIC PANEL
ALT: 12 IU/L (ref 0–32)
AST: 17 IU/L (ref 0–40)
Albumin/Globulin Ratio: 1.4 (ref 1.2–2.2)
Albumin: 4.3 g/dL (ref 3.8–4.8)
Alkaline Phosphatase: 63 IU/L (ref 44–121)
BUN/Creatinine Ratio: 23 (ref 9–23)
BUN: 18 mg/dL (ref 6–24)
Bilirubin Total: 0.3 mg/dL (ref 0.0–1.2)
CO2: 22 mmol/L (ref 20–29)
Calcium: 9.3 mg/dL (ref 8.7–10.2)
Chloride: 102 mmol/L (ref 96–106)
Creatinine, Ser: 0.79 mg/dL (ref 0.57–1.00)
Globulin, Total: 3 g/dL (ref 1.5–4.5)
Glucose: 85 mg/dL (ref 65–99)
Potassium: 4.4 mmol/L (ref 3.5–5.2)
Sodium: 137 mmol/L (ref 134–144)
Total Protein: 7.3 g/dL (ref 6.0–8.5)
eGFR: 96 mL/min/{1.73_m2} (ref >59)

## 2020-07-21 LAB — CBC
Hematocrit: 45.4 % (ref 34.0–46.6)
Hemoglobin: 15.5 g/dL (ref 11.1–15.9)
MCH: 30.6 pg (ref 26.6–33.0)
MCHC: 34.1 g/dL (ref 31.5–35.7)
MCV: 90 fL (ref 79–97)
Platelets: 257 10*3/uL (ref 150–450)
RBC: 5.07 x10E6/uL (ref 3.77–5.28)
RDW: 12.3 % (ref 11.7–15.4)
WBC: 8.7 10*3/uL (ref 3.4–10.8)

## 2020-07-21 LAB — LIPID PANEL
Chol/HDL Ratio: 3.8 ratio (ref 0.0–4.4)
Cholesterol, Total: 193 mg/dL (ref 100–199)
HDL: 51 mg/dL (ref >39)
LDL Chol Calc (NIH): 126 mg/dL — ABNORMAL HIGH (ref 0–99)
Triglycerides: 88 mg/dL (ref 0–149)
VLDL Cholesterol Cal: 16 mg/dL (ref 5–40)

## 2020-07-21 LAB — TSH: TSH: 1.41 u[IU]/mL (ref 0.450–4.500)

## 2020-07-22 ENCOUNTER — Encounter: Payer: Self-pay | Admitting: Cardiovascular Disease

## 2021-01-14 ENCOUNTER — Other Ambulatory Visit: Payer: Self-pay | Admitting: Cardiovascular Disease

## 2021-04-12 ENCOUNTER — Other Ambulatory Visit: Payer: Self-pay | Admitting: Cardiovascular Disease

## 2021-07-08 ENCOUNTER — Other Ambulatory Visit: Payer: Self-pay | Admitting: Cardiovascular Disease

## 2021-12-28 ENCOUNTER — Other Ambulatory Visit: Payer: Self-pay | Admitting: Cardiovascular Disease

## 2022-07-20 ENCOUNTER — Other Ambulatory Visit: Payer: Self-pay | Admitting: Cardiovascular Disease

## 2022-08-14 ENCOUNTER — Other Ambulatory Visit: Payer: Self-pay | Admitting: Cardiovascular Disease

## 2022-10-16 ENCOUNTER — Other Ambulatory Visit: Payer: Self-pay | Admitting: Cardiovascular Disease

## 2022-10-23 NOTE — Progress Notes (Deleted)
Cardiology Clinic Note   Patient Name: Barbara Romero Date of Encounter: 10/23/2022  Primary Care Provider:  Pcp, No Primary Cardiologist:  Nicki Guadalajara, MD  Patient Profile    42 year old female with history of hypertension, OSA, and.  She is being followed by Dr. Tresa Endo for sleep medicine as well as hypertension.  Past Medical History    Past Medical History:  Diagnosis Date   GERD (gastroesophageal reflux disease)    Hypertension    Past Surgical History:  Procedure Laterality Date   BACK SURGERY     L5    Allergies  Allergies  Allergen Reactions   Hydrocodone Nausea Only and Other (See Comments)    Disoriented     History of Present Illness    ***  Home Medications    Current Outpatient Medications  Medication Sig Dispense Refill   cholecalciferol (VITAMIN D) 1000 units tablet Take 2,000 Units by mouth daily.     hydrochlorothiazide (HYDRODIURIL) 25 MG tablet TAKE 0.5 TABLETS (12.5 MG TOTAL) BY MOUTH AS NEEDED (INCREASED SWELLING). 90 tablet 1   valsartan-hydrochlorothiazide (DIOVAN-HCT) 160-12.5 MG tablet TAKE 1 TABLET BY MOUTH ONCE DAILY . APPOINTMENT REQUIRED FOR FUTURE REFILLS 15 tablet 0   No current facility-administered medications for this visit.     Family History    Family History  Problem Relation Age of Onset   Cancer Father        prostate   Hypertension Father    She indicated that her mother is alive. She indicated that her father is alive.  Social History    Social History   Socioeconomic History   Marital status: Single    Spouse name: Not on file   Number of children: Not on file   Years of education: Not on file   Highest education level: Not on file  Occupational History   Not on file  Tobacco Use   Smoking status: Every Day    Packs/day: 1.00    Years: 23.00    Additional pack years: 0.00    Total pack years: 23.00    Types: Cigarettes   Smokeless tobacco: Never  Substance and Sexual Activity   Alcohol use: Yes     Comment: social   Drug use: No   Sexual activity: Not Currently    Birth control/protection: None  Other Topics Concern   Not on file  Social History Narrative   Not on file   Social Determinants of Health   Financial Resource Strain: Not on file  Food Insecurity: Not on file  Transportation Needs: Not on file  Physical Activity: Not on file  Stress: Not on file  Social Connections: Not on file  Intimate Partner Violence: Not on file     Review of Systems    General:  No chills, fever, night sweats or weight changes.  Cardiovascular:  No chest pain, dyspnea on exertion, edema, orthopnea, palpitations, paroxysmal nocturnal dyspnea. Dermatological: No rash, lesions/masses Respiratory: No cough, dyspnea Urologic: No hematuria, dysuria Abdominal:   No nausea, vomiting, diarrhea, bright red blood per rectum, melena, or hematemesis Neurologic:  No visual changes, wkns, changes in mental status. All other systems reviewed and are otherwise negative except as noted above.       Physical Exam    VS:  There were no vitals taken for this visit. , BMI There is no height or weight on file to calculate BMI.     GEN: Well nourished, well developed, in no acute distress. HEENT: normal.  Neck: Supple, no JVD, carotid bruits, or masses. Cardiac: RRR, no murmurs, rubs, or gallops. No clubbing, cyanosis, edema.  Radials/DP/PT 2+ and equal bilaterally.  Respiratory:  Respirations regular and unlabored, clear to auscultation bilaterally. GI: Soft, nontender, nondistended, BS + x 4. MS: no deformity or atrophy. Skin: warm and dry, no rash. Neuro:  Strength and sensation are intact. Psych: Normal affect.      Lab Results  Component Value Date   WBC 8.7 07/20/2020   HGB 15.5 07/20/2020   HCT 45.4 07/20/2020   MCV 90 07/20/2020   PLT 257 07/20/2020   Lab Results  Component Value Date   CREATININE 0.79 07/20/2020   BUN 18 07/20/2020   NA 137 07/20/2020   K 4.4 07/20/2020   CL 102  07/20/2020   CO2 22 07/20/2020   Lab Results  Component Value Date   ALT 12 07/20/2020   AST 17 07/20/2020   ALKPHOS 63 07/20/2020   BILITOT 0.3 07/20/2020   Lab Results  Component Value Date   CHOL 193 07/20/2020   HDL 51 07/20/2020   LDLCALC 126 (H) 07/20/2020   TRIG 88 07/20/2020   CHOLHDL 3.8 07/20/2020    Lab Results  Component Value Date   HGBA1C 5.4 07/20/2020     Review of Prior Studies    Echocardiogram 04/04/2016 Left ventricle: The cavity size was normal. Wall thickness was    increased in a pattern of mild LVH. Systolic function was normal.    The estimated ejection fraction was in the range of 60% to 65%.    Left ventricular diastolic function parameters were normal.  - Right ventricle: The cavity size was normal. Systolic function    was normal.    Assessment & Plan   1.  ***     {Are you ordering a CV Procedure (e.g. stress test, cath, DCCV, TEE, etc)?   Press F2        :[id number]}   Signed, Bettey Mare. Liborio Nixon, ANP, AACC   10/23/2022 7:58 AM      Office [phone] Fax [phone]  Notice: This dictation was prepared with Dragon dictation along with smaller phrase technology. Any transcriptional errors that result from this process are unintentional and may not be corrected upon review.

## 2022-10-27 ENCOUNTER — Ambulatory Visit: Payer: No Typology Code available for payment source | Admitting: Adult Health

## 2022-10-27 ENCOUNTER — Telehealth: Payer: Self-pay | Admitting: Cardiovascular Disease

## 2022-10-27 NOTE — Telephone Encounter (Signed)
*  STAT* If patient is at the pharmacy, call can be transferred to refill team.   1. Which medications need to be refilled? (please list name of each medication and dose if known)   valsartan-hydrochlorothiazide (DIOVAN-HCT) 160-12.5 MG tablet    2. Which pharmacy/location (including street and city if local pharmacy) is medication to be sent to?[facility] - [place], [place] - [address] Phone: [phone]  Fax: [phone]    3. Do they need a 30 day or 90 day supply? 90 day   Pt has scheduled appt on 8/16

## 2022-11-01 ENCOUNTER — Other Ambulatory Visit: Payer: Self-pay | Admitting: Cardiovascular Disease

## 2022-11-01 MED ORDER — VALSARTAN-HYDROCHLOROTHIAZIDE 160-12.5 MG PO TABS: 1.0000 | ORAL_TABLET | Freq: Every day | ORAL | 0 refills | Status: DC

## 2022-11-01 NOTE — Addendum Note (Signed)
Addended by: Stevan Born on: 11/01/2022 04:07 PM   Modules accepted: Orders

## 2022-11-22 ENCOUNTER — Ambulatory Visit: Payer: No Typology Code available for payment source | Admitting: Dermatology

## 2022-11-22 DIAGNOSIS — L821 Other seborrheic keratosis: Secondary | ICD-10-CM

## 2022-11-22 DIAGNOSIS — Z7189 Other specified counseling: Secondary | ICD-10-CM

## 2022-11-22 DIAGNOSIS — L820 Inflamed seborrheic keratosis: Secondary | ICD-10-CM | POA: Diagnosis not present

## 2022-11-22 DIAGNOSIS — L700 Acne vulgaris: Secondary | ICD-10-CM

## 2022-11-22 DIAGNOSIS — L578 Other skin changes due to chronic exposure to nonionizing radiation: Secondary | ICD-10-CM

## 2022-11-22 DIAGNOSIS — L814 Other melanin hyperpigmentation: Secondary | ICD-10-CM

## 2022-11-22 DIAGNOSIS — L918 Other hypertrophic disorders of the skin: Secondary | ICD-10-CM

## 2022-11-22 DIAGNOSIS — W908XXA Exposure to other nonionizing radiation, initial encounter: Secondary | ICD-10-CM | POA: Diagnosis not present

## 2022-11-22 DIAGNOSIS — D229 Melanocytic nevi, unspecified: Secondary | ICD-10-CM

## 2022-11-22 DIAGNOSIS — D1801 Hemangioma of skin and subcutaneous tissue: Secondary | ICD-10-CM

## 2022-11-22 DIAGNOSIS — Z1283 Encounter for screening for malignant neoplasm of skin: Secondary | ICD-10-CM

## 2022-11-22 DIAGNOSIS — Z79899 Other long term (current) drug therapy: Secondary | ICD-10-CM

## 2022-11-22 MED ORDER — TRETINOIN 0.025 % EX CREA: TOPICAL_CREAM | CUTANEOUS | 6 refills | Status: DC

## 2022-11-22 NOTE — Patient Instructions (Signed)
Melanoma ABCDEs  Melanoma is the most dangerous type of skin cancer, and is the leading cause of death from skin disease.  You are more likely to develop melanoma if you: Have light-colored skin, light-colored eyes, or red or blond hair Spend a lot of time in the sun Tan regularly, either outdoors or in a tanning bed Have had blistering sunburns, especially during childhood Have a close family member who has had a melanoma Have atypical moles or large birthmarks  Early detection of melanoma is key since treatment is typically straightforward and cure rates are extremely high if we catch it early.   The first sign of melanoma is often a change in a mole or a new dark spot.  The ABCDE system is a way of remembering the signs of melanoma.  A for asymmetry:  The two halves do not match. B for border:  The edges of the growth are irregular. C for color:  A mixture of colors are present instead of an even brown color. D for diameter:  Melanomas are usually (but not always) greater than 6mm - the size of a pencil eraser. E for evolution:  The spot keeps changing in size, shape, and color.  Please check your skin once per month between visits. You can use a small mirror in front and a large mirror behind you to keep an eye on the back side or your body.   If you see any new or changing lesions before your next follow-up, please call to schedule a visit.  Please continue daily skin protection including broad spectrum sunscreen SPF 30+ to sun-exposed areas, reapplying every 2 hours as needed when you're outdoors.   Staying in the shade or wearing long sleeves, sun glasses (UVA+UVB protection) and wide brim hats (4-inch brim around the entire circumference of the hat) are also recommended for sun protection.     Due to recent changes in healthcare laws, you may see results of your pathology and/or laboratory studies on MyChart before the doctors have had a chance to review them. We understand that  in some cases there may be results that are confusing or concerning to you. Please understand that not all results are received at the same time and often the doctors may need to interpret multiple results in order to provide you with the best plan of care or course of treatment. Therefore, we ask that you please give [place] 2 business days to thoroughly review all your results before contacting the office for clarification. Should we see a critical lab result, you will be contacted sooner.   If You Need Anything After Your Visit  If you have any questions or concerns for your doctor, please call our main line at [phone] and press option 4 to reach your doctor's medical assistant. If no one answers, please leave a voicemail as directed and we will return your call as soon as possible. Messages left after 4 pm will be answered the following business day.   You may also send [place] a message via MyChart. We typically respond to MyChart messages within 1-2 business days.  For prescription refills, please ask your pharmacy to contact our office. Our fax number is [phone].  If you have an urgent issue when the clinic is closed that cannot wait until the next business day, you can page your doctor at the number below.    Please note that while we do our best to be available for urgent issues outside of office hours,  we are not available 24/7.   If you have an urgent issue and are unable to reach [place], you may choose to seek medical care at your doctor's office, retail clinic, urgent care center, or emergency room.  If you have a medical emergency, please immediately call 911 or go to the emergency department.  Pager Numbers  - Dr. Gwen Pounds: [phone]  - Dr. Roseanne Reno: [phone]  In the event of inclement weather, please call our main line at [phone] for an update on the status of any delays or closures.  Dermatology Medication Tips: Please keep the boxes that topical medications come  in in order to help keep track of the instructions about where and how to use these. Pharmacies typically print the medication instructions only on the boxes and not directly on the medication tubes.   If your medication is too expensive, please contact our office at [phone] option 4 or send [place] a message through MyChart.   We are unable to tell what your co-pay for medications will be in advance as this is different depending on your insurance coverage. However, we may be able to find a substitute medication at lower cost or fill out paperwork to get insurance to cover a needed medication.   If a prior authorization is required to get your medication covered by your insurance company, please allow [place] 1-2 business days to complete this process.  Drug prices often vary depending on where the prescription is filled and some pharmacies may offer cheaper prices.  The website www.goodrx.com contains coupons for medications through different pharmacies. The prices here do not account for what the cost may be with help from insurance (it may be cheaper with your insurance), but the website can give you the price if you did not use any insurance.  - You can print the associated coupon and take it with your prescription to the pharmacy.  - You may also stop by our office during regular business hours and pick up a GoodRx coupon card.  - If you need your prescription sent electronically to a different pharmacy, notify our office through [facility] or by phone at [phone] option 4.     Si Usted Necesita Algo Despus de Su Visita  Tambin puede enviarnos un mensaje a travs de Clinical cytogeneticist. Por lo general respondemos a los mensajes de MyChart en el transcurso de 1 a 2 das hbiles.  Para renovar recetas, por favor pida a su farmacia que se ponga en contacto con nuestra oficina. Annie Sable de fax es [place] [phone].  Si tiene un asunto urgente cuando la clnica est cerrada y que no puede  esperar hasta el siguiente da hbil, puede llamar/localizar a su doctor(a) al nmero que aparece a continuacin.   Por favor, tenga en cuenta que aunque hacemos todo lo posible para estar disponibles para asuntos urgentes fuera del horario de [place], no estamos disponibles las 24 horas del da, los 7 [address]  [address] de la [place].   Si tiene un problema urgente y no puede comunicarse con nosotros, puede optar por buscar atencin mdica  en el consultorio de su doctor(a), en una clnica privada, en un centro de atencin urgente o en una sala de emergencias.  Si tiene Engineer, drilling, por favor llame inmediatamente al 911 o vaya a la sala de emergencias.  Nmeros de bper  - Dr. Gwen Pounds: [phone]  - Dra. Roseanne Reno: [phone]  En caso de inclemencias del tiempo, por favor llame a nuestra lnea principal al [phone] para  una actualizacin [employer] de cualquier retraso o cierre.  Consejos para la medicacin en dermatologa: Por favor, guarde las cajas en las que vienen los medicamentos de uso tpico para ayudarle a seguir las instrucciones sobre dnde y cmo usarlos. Las farmacias generalmente imprimen las instrucciones del medicamento slo en las cajas y no directamente en los tubos del [place].   Si su medicamento es muy caro, por favor, pngase en contacto con Rolm [place]ala llamando al [phone] y presione la opcin 4 o envenos un mensaje a travs de Clinical cytogeneticist.   No podemos decirle cul ser su copago por los medicamentos por adelantado ya que esto es diferente dependiendo de la cobertura de su seguro. Sin embargo, es posible que podamos encontrar un medicamento sustituto a Audiological scientist un formulario para que el seguro cubra el medicamento que se considera necesario.   Si se requiere una autorizacin previa para que su compaa de seguros [place] su medicamento, por favor permtanos de 1 a 2 das hbiles para completar [address]  Los precios de los medicamentos  varan con frecuencia dependiendo del Environmental consultant de dnde se surte la receta y alguna farmacias pueden ofrecer precios ms baratos.  El sitio web www.goodrx.com tiene cupones para medicamentos de Health and safety inspector. Los precios aqu no tienen en cuenta lo que podra costar con la ayuda del seguro (puede ser ms barato con su seguro), pero el sitio web puede darle el precio si no utiliz Tourist information centre manager.  - Puede imprimir el cupn correspondiente y llevarlo con su receta a la farmacia.  - Tambin puede pasar por nuestra oficina durante el horario de atencin regular y Education officer, museum una tarjeta de cupones de GoodRx.  - Si necesita que su receta se enve electrnicamente a una farmacia diferente, informe a nuestra oficina a travs de MyChart de [place] o por telfono llamando al [phone] y presione la opcin 4.

## 2022-11-22 NOTE — Progress Notes (Signed)
New Patient Visit   Subjective  Barbara Romero is a 42 y.o. female who presents for the following: Skin Cancer Screening and Full Body Skin Exam  Pt c/o skin tags, moles, poison oak at right wrist.  Denies family or personal history of skin cancer.   The patient presents for Total-Body Skin Exam (TBSE) for skin cancer screening and mole check. The patient has spots, moles and lesions to be evaluated, some may be new or changing and the patient may have concern these could be cancer.    The following portions of the chart were reviewed this encounter and updated as appropriate: medications, allergies, medical history  Review of Systems:  No other skin or systemic complaints except as noted in HPI or Assessment and Plan.  Objective  Well appearing patient in no apparent distress; mood and affect are within normal limits.  A full examination was performed including scalp, head, eyes, ears, nose, lips, neck, chest, axillae, abdomen, back, buttocks, bilateral upper extremities, bilateral lower extremities, hands, feet, fingers, toes, fingernails, and toenails. All findings within normal limits unless otherwise noted below.   Relevant physical exam findings are noted in the Assessment and Plan.  Right Lat Lower Eyelid Margin x 1 Erythematous stuck-on, waxy papule or plaque  Face Deep and confluent comedones and milium of the face.  L scapula x 1,  L forearm near the wrist x 1, L breast x 1 (3) Erythematous stuck-on, waxy papule or plaque.      R axilla x 1 tx with LN2 (7) Fleshy, skin-colored pedunculated papules.      Assessment & Plan   SKIN CANCER SCREENING PERFORMED TODAY.  ACTINIC DAMAGE - Chronic condition, secondary to cumulative UV/sun exposure - diffuse scaly erythematous macules with underlying dyspigmentation - Recommend daily broad spectrum sunscreen SPF 30+ to sun-exposed areas, reapply every 2 hours as needed.  - Staying in the shade or wearing long  sleeves, sun glasses (UVA+UVB protection) and wide brim hats (4-inch brim around the entire circumference of the hat) are also recommended for sun protection.  - Call for new or changing lesions.  LENTIGINES, SEBORRHEIC KERATOSES, HEMANGIOMAS - Benign normal skin lesions - Benign-appearing - Call for any changes  MELANOCYTIC NEVI - Tan-brown and/or pink-flesh-colored symmetric macules and papules - Benign appearing on exam today - Observation - Call clinic for new or changing moles - Recommend daily use of broad spectrum spf 30+ sunscreen to sun-exposed areas.   Inflamed seborrheic keratosis Right Lat Lower Eyelid Margin x 1  Symptomatic, irritating, patient would like treated.   Destruction of lesion - Right Lat Lower Eyelid Margin x 1 Complexity: simple   Destruction method: cryotherapy   Informed consent: discussed and consent obtained   Timeout:  patient name, date of birth, surgical site, and procedure verified Lesion destroyed using liquid nitrogen: Yes   Region frozen until ice ball extended beyond lesion: Yes   Outcome: patient tolerated procedure well with no complications   Post-procedure details: wound care instructions given    Acne vulgaris Face  Start Tretinoin 0.025% cream QHS. Topical retinoid medications like tretinoin/Retin-A, adapalene/Differin, tazarotene/Fabior, and Epiduo/Epiduo Forte can cause dryness and irritation when first started. Only apply a pea-sized amount to the entire affected area. Avoid applying it around the eyes, edges of mouth and creases at the nose. If you experience irritation, use a good moisturizer first and/or apply the medicine less often. If you are doing well with the medicine, you can increase how often you use  it until you are applying every night. Be careful with sun protection while using this medication as it can make you sensitive to the sun. This medicine should not be used by pregnant women.   Discussed Theraclear red light  acne treatment. Patient defers at this time due to having to come into the office for treatment.   Seborrheic keratosis, inflamed (3) L scapula x 1,  L forearm near the wrist x 1, L breast x 1  Symptomatic, irritating, patient would like treated.    Destruction of lesion - L scapula x 1,  L forearm near the wrist x 1, L breast x 1 (3) Complexity: simple   Destruction method: cryotherapy   Informed consent: discussed and consent obtained   Timeout:  patient name, date of birth, surgical site, and procedure verified Lesion destroyed using liquid nitrogen: Yes   Region frozen until ice ball extended beyond lesion: Yes   Outcome: patient tolerated procedure well with no complications   Post-procedure details: wound care instructions given    Skin tag (7) R axilla x 1 tx with LN2  Symptomatic, irritating, patient would like treated.  ACROCHORDONS (Skin Tags) - Removal desired by patient - Fleshy, skin-colored pedunculated papules - Benign appearing.  - Patient desires removal. Reviewed that this is not covered by insurance and they will be charged a cosmetic fee for removal. Patient signed non-covered consent.  Procedure Note: - Prior to the procedure, reviewed the expected small wound. Also reviewed the risk of leaving a small scar and the small risk of infection.  PROCEDURE - The areas were prepped with isopropyl alcohol. A small amount of lidocaine 1% with epinephrine was injected at the base of each lesion to achieve good local anesthesia. The skin tags were removed using a snip technique. Aluminum chloride was used for hemostasis. Petrolatum and a bandage were applied. The procedure was tolerated well. - Wound care was reviewed with the patient. They were advised to call with any concerns.  - Locations: L axilla x 3, R axilla x 3 - Total number of treated acrochordons 6    Destruction of lesion - R axilla x 1 tx with LN2 (7) Complexity: simple   Destruction method: cryotherapy    Informed consent: discussed and consent obtained   Timeout:  patient name, date of birth, surgical site, and procedure verified Lesion destroyed using liquid nitrogen: Yes   Region frozen until ice ball extended beyond lesion: Yes   Outcome: patient tolerated procedure well with no complications   Post-procedure details: wound care instructions given     Return in about 6 months (around 05/25/2023) for acne and ISK follow up.  Maylene Roes, CMA, am acting as scribe for Armida Sans, MD .  Documentation: I have reviewed the above documentation for accuracy and completeness, and I agree with the above.  Armida Sans, MD

## 2022-11-27 NOTE — Progress Notes (Signed)
Cardiology Clinic Note   Patient Name: Barbara Romero Date of Encounter: 12/01/2022  Primary Care Provider:  Pcp, No Primary Cardiologist:  Barbara Guadalajara, MD  Patient Profile    42 year old female with history of OSA on CPAP, hypertension, obesity.  Last seen by Dr. Tresa Romero on 07/20/2020.  She has been away from cardiac follow-up as she has been in between jobs.  She is now working at a bank in the fraud department.  She now has insurance is able to fill her medications.  During the time in between appointments she did cut her pills in half in order to make them last longer.  She denies any chest pain, dyspnea on exertion, she has been compliant with her CPAP without interruption.  Past Medical History    Past Medical History:  Diagnosis Date   GERD (gastroesophageal reflux disease)    Hypertension    Past Surgical History:  Procedure Laterality Date   BACK SURGERY     L5    Allergies  Allergies  Allergen Reactions   Hydrocodone Nausea Only and Other (See Comments)    Disoriented     History of Present Illness    Mr. Barbara Romero returns to the office today for ongoing assessment and management of hypertension.  She is also followed by Dr. Tresa Romero for OSA and remains on CPAP.  She has a follow-up appointment November 2024 for management.  She has been doing well with exception of being in between jobs and having to cut her pills in half temporarily until she has regained appointment and insurance.  She has been asymptomatic.  She is concerned about her weight and would like to lose weight but she is now getting back into her usual routine.  She does use a bicycle 30 minutes a day in her home, and also does gardening.  Unfortunately she continues to smoke.  Home Medications    Current Outpatient Medications  Medication Sig Dispense Refill   cholecalciferol (VITAMIN D) 1000 units tablet Take 2,000 Units by mouth daily.     tretinoin (RETIN-A) 0.025 % cream Apply a pea sized amount to the  entire face QHS. 45 g 6   valsartan-hydrochlorothiazide (DIOVAN-HCT) 160-12.5 MG tablet Take 1 tablet by mouth daily. 30 tablet 0   hydrochlorothiazide (HYDRODIURIL) 25 MG tablet TAKE 0.5 TABLETS (12.5 MG TOTAL) BY MOUTH AS NEEDED (INCREASED SWELLING). 90 tablet 1   No current facility-administered medications for this visit.     Family History    Family History  Problem Relation Age of Onset   Cancer Father        prostate   Hypertension Father    She indicated that her mother is alive. She indicated that her father is alive.  Social History    Social History   Socioeconomic History   Marital status: Single    Spouse name: Not on file   Number of children: Not on file   Years of education: Not on file   Highest education level: Not on file  Occupational History   Not on file  Tobacco Use   Smoking status: Every Day    Current packs/day: 1.00    Average packs/day: 1 pack/day for 23.0 years (23.0 ttl pk-yrs)    Types: Cigarettes   Smokeless tobacco: Never  Substance and Sexual Activity   Alcohol use: Yes    Comment: social   Drug use: No   Sexual activity: Not Currently    Birth control/protection: None  Other Topics Concern  Not on file  Social History Narrative   Not on file   Social Determinants of Health   Financial Resource Strain: Not on file  Food Insecurity: Not on file  Transportation Needs: Not on file  Physical Activity: Not on file  Stress: Not on file  Social Connections: Not on file  Intimate Partner Violence: Not on file     Review of Systems    General:  No chills, fever, night sweats or weight changes.  Cardiovascular:  No chest pain, dyspnea on exertion, edema, orthopnea, palpitations, paroxysmal nocturnal dyspnea. Dermatological: No rash, lesions/masses Respiratory: No cough, dyspnea Urologic: No hematuria, dysuria Abdominal:   No nausea, vomiting, diarrhea, bright red blood per rectum, melena, or hematemesis Neurologic:  No visual  changes, wkns, changes in mental status. All other systems reviewed and are otherwise negative except as noted above.       Physical Exam    VS:  BP 132/84   Pulse (!) 101   Ht 5\' 6"  (1.676 m)   Wt (!) 303 lb 9.6 oz (137.7 kg)   SpO2 98%   BMI 49.00 kg/m  , BMI Body mass index is 49 kg/m.     GEN: Well nourished, well developed, in no acute distress. HEENT: normal. Neck: Supple, no JVD, carotid bruits, or masses. Cardiac: RRR, tachycardia, no murmurs, rubs, or gallops. No clubbing, cyanosis, edema.  Radials/DP/PT 2+ and equal bilaterally.  Respiratory:  Respirations regular and unlabored, clear to auscultation bilaterally. GI: Soft, nontender, nondistended, BS + x 4. MS: no deformity or atrophy. Skin: warm and dry, no rash. Neuro:  Strength and sensation are intact. Psych: Normal affect.      Lab Results  Component Value Date   WBC 8.7 07/20/2020   HGB 15.5 07/20/2020   HCT 45.4 07/20/2020   MCV 90 07/20/2020   PLT 257 07/20/2020   Lab Results  Component Value Date   CREATININE 0.79 07/20/2020   BUN 18 07/20/2020   NA 137 07/20/2020   K 4.4 07/20/2020   CL 102 07/20/2020   CO2 22 07/20/2020   Lab Results  Component Value Date   ALT 12 07/20/2020   AST 17 07/20/2020   ALKPHOS 63 07/20/2020   BILITOT 0.3 07/20/2020   Lab Results  Component Value Date   CHOL 193 07/20/2020   HDL 51 07/20/2020   LDLCALC 126 (H) 07/20/2020   TRIG 88 07/20/2020   CHOLHDL 3.8 07/20/2020    Lab Results  Component Value Date   HGBA1C 5.4 07/20/2020     Review of Prior Studies    Echocardiogram 04/04/2026 Study Conclusions   - Left ventricle: The cavity size was normal. Wall thickness was    increased in a pattern of mild LVH. Systolic function was normal.    The estimated ejection fraction was in the range of 60% to 65%.    Left ventricular diastolic function parameters were normal.  - Right ventricle: The cavity size was normal. Systolic function    was normal.     Assessment & Plan   1.  Hypertension: Blood pressure is well-controlled today on current medication regimen.  She started taking it as directed since she has been newly employed.  I will give her refills on valsartan HCTZ and supplemental HCTZ as needed for edema.  I will check a BMET today for kidney function.  Also check a CBC.  2.  OSA: She remains on CPAP and has been compliant with this.  She will follow-up with  Dr. Tresa Romero in November 2024.  3.  Ongoing tobacco abuse: Smoke cessation is recommended in light of obesity, hypertension and hyperlipidemia to reduce her cardiovascular risk factors.  4.  Hyperlipidemia:: Most recent LDL 126 in April 2022, total cholesterol 193.  Triglycerides 88.  HDL 51.  I have offered to repeat lipids LFTs but she is going to be seeing her primary care provider.  I have advised her that these needs to be done fasting, and she is already eaten today.  She is currently not on a statin         Signed, Bettey Mare. Liborio Nixon, ANP, AACC   12/01/2022 10:27 AM      Office [phone] Fax [phone]  Notice: This dictation was prepared with Dragon dictation along with smaller phrase technology. Any transcriptional errors that result from this process are unintentional and may not be corrected upon review.

## 2022-12-01 ENCOUNTER — Encounter: Payer: Self-pay | Admitting: Adult Health

## 2022-12-01 ENCOUNTER — Encounter: Payer: Self-pay | Admitting: Dermatology

## 2022-12-01 ENCOUNTER — Ambulatory Visit: Payer: No Typology Code available for payment source | Attending: Adult Health | Admitting: Adult Health

## 2022-12-01 VITALS — BP 132/84 | HR 101 | Ht 66.0 in | Wt 303.6 lb

## 2022-12-01 DIAGNOSIS — F17200 Nicotine dependence, unspecified, uncomplicated: Secondary | ICD-10-CM

## 2022-12-01 DIAGNOSIS — I10 Essential (primary) hypertension: Secondary | ICD-10-CM

## 2022-12-01 DIAGNOSIS — G4733 Obstructive sleep apnea (adult) (pediatric): Secondary | ICD-10-CM

## 2022-12-01 MED ORDER — HYDROCHLOROTHIAZIDE 25 MG PO TABS: 12.5000 mg | ORAL_TABLET | ORAL | 1 refills | Status: DC | PRN

## 2022-12-01 MED ORDER — VALSARTAN-HYDROCHLOROTHIAZIDE 160-12.5 MG PO TABS: 1.0000 | ORAL_TABLET | Freq: Every day | ORAL | 1 refills | Status: DC

## 2022-12-01 NOTE — Patient Instructions (Signed)
Medication Instructions:  No Changes *If you need a refill on your cardiac medications before your next appointment, please call your pharmacy*   Lab Work: BMET, CBC  If you have labs (blood work) drawn today and your tests are completely normal, you will receive your results only by: MyChart Message (if you have MyChart) OR A paper copy in the mail If you have any lab test that is abnormal or we need to change your treatment, we will call you to review the results.   Testing/Procedures: No Testing   Follow-Up: At [facility], you and your health needs are our priority.  As part of our continuing mission to provide you with exceptional heart care, we have created designated Provider Care Teams.  These Care Teams include your primary Cardiologist (physician) and Advanced Practice Providers (APPs -  Physician Assistants and Nurse Practitioners) who all work together to provide you with the care you need, when you need it.  We recommend signing up for the patient portal called "MyChart".  Sign up information is provided on this After Visit Summary.  MyChart is used to connect with patients for Virtual Visits (Telemedicine).  Patients are able to view lab/test results, encounter notes, upcoming appointments, etc.  Non-urgent messages can be sent to your provider as well.   To learn more about what you can do with MyChart, go to [email or web address].    Your next appointment:   Keep Scheduled Appointment  Provider:   Nicki Guadalajara, MD

## 2022-12-02 LAB — BASIC METABOLIC PANEL
BUN/Creatinine Ratio: 19 (ref 9–23)
BUN: 15 mg/dL (ref 6–24)
CO2: 23 mmol/L (ref 20–29)
Calcium: 9.6 mg/dL (ref 8.7–10.2)
Chloride: 101 mmol/L (ref 96–106)
Creatinine, Ser: 0.8 mg/dL (ref 0.57–1.00)
Glucose: 89 mg/dL (ref 70–99)
Potassium: 4.5 mmol/L (ref 3.5–5.2)
Sodium: 138 mmol/L (ref 134–144)
eGFR: 93 mL/min/{1.73_m2} (ref >59)

## 2022-12-02 LAB — CBC
Hematocrit: 46.3 % (ref 34.0–46.6)
Hemoglobin: 15.5 g/dL (ref 11.1–15.9)
MCH: 29.6 pg (ref 26.6–33.0)
MCHC: 33.5 g/dL (ref 31.5–35.7)
MCV: 89 fL (ref 79–97)
Platelets: 273 10*3/uL (ref 150–450)
RBC: 5.23 x10E6/uL (ref 3.77–5.28)
RDW: 12.8 % (ref 11.7–15.4)
WBC: 11 10*3/uL — ABNORMAL HIGH (ref 3.4–10.8)

## 2022-12-07 ENCOUNTER — Telehealth: Payer: Self-pay | Admitting: Adult Health

## 2022-12-07 NOTE — Telephone Encounter (Signed)
Pt returning call from yesterday regarding results. Please advise

## 2022-12-07 NOTE — Telephone Encounter (Signed)
Returned call for Labs.   I have reviewed the labs.  Elevated WBC's. If she has been on steroids or had an injection (infection) this would cause this to occur.  Otherwise, all levels are normal in the CBC and the chemistries. No changes in the medication regimen.    Patient states no infection and no steroids.  She states she did have multiple skin tags and moles removed prior to her labs, if this might be cause.  Advised would let provider be aware but no changes at this time.

## 2023-03-02 ENCOUNTER — Encounter: Payer: Self-pay | Admitting: Cardiovascular Disease

## 2023-03-02 ENCOUNTER — Ambulatory Visit
Payer: No Typology Code available for payment source | Attending: Cardiovascular Disease | Admitting: Cardiovascular Disease

## 2023-03-02 DIAGNOSIS — E7800 Pure hypercholesterolemia, unspecified: Secondary | ICD-10-CM

## 2023-03-02 DIAGNOSIS — I10 Essential (primary) hypertension: Secondary | ICD-10-CM | POA: Diagnosis not present

## 2023-03-02 DIAGNOSIS — E785 Hyperlipidemia, unspecified: Secondary | ICD-10-CM

## 2023-03-02 DIAGNOSIS — G4733 Obstructive sleep apnea (adult) (pediatric): Secondary | ICD-10-CM

## 2023-03-02 NOTE — Patient Instructions (Signed)
Medication Instructions:  No changes *If you need a refill on your cardiac medications before your next appointment, please call your pharmacy*   Lab Work: CMP, TSH, Lipid panel, LPa If you have labs (blood work) drawn today and your tests are completely normal, you will receive your results only by: MyChart Message (if you have MyChart) OR A paper copy in the mail If you have any lab test that is abnormal or we need to change your treatment, we will call you to review the results.   Follow-Up: At [facility], you and your health needs are our priority.  As part of our continuing mission to provide you with exceptional heart care, we have created designated Provider Care Teams.  These Care Teams include your primary Cardiologist (physician) and Advanced Practice Providers (APPs -  Physician Assistants and Nurse Practitioners) who all work together to provide you with the care you need, when you need it.  We recommend signing up for the patient portal called "MyChart".  Sign up information is provided on this After Visit Summary.  MyChart is used to connect with patients for Virtual Visits (Telemedicine).  Patients are able to view lab/test results, encounter notes, upcoming appointments, etc.  Non-urgent messages can be sent to your provider as well.   To learn more about what you can do with MyChart, go to [email or web address].    Your next appointment:    Follow up 3 months after getting new machine (if you get a new machine) if you do not get a new machine, then follow up in one year  Provider:   Nicki Guadalajara, MD

## 2023-03-02 NOTE — Progress Notes (Signed)
Cardiology Office Note    Date:  03/13/2023   ID:  Barbara Romero, DOB Aug 28, 1980, MRN [id number]  PCP:  Oneita Hurt, No  Cardiologist:  Nicki Guadalajara, MD   2 1/2 year F/U  History of Present Illness:  Barbara Romero is a 42 y.o. female who was referred through the courtesy of Dr. Thomasene Lot for sleep evaluation and hypertension. I saw her initially in December 2017 and last evaluated her in April 2022.  She presents for follow-up evaluation.   [name] has a long-standing history of morbid obesity, and while she was living in [place], [place] in 2002 was scheduled to undergo a sleep study.  However, on the night of her planned sleep study, it had snowed in her study was canceled.  She never followed up with this and never had subsequent evaluation.  She now lives in the [place] area at home with her Barbara Romero.  Over the past 15 years she  continued to have difficulty with sleep.  Recently, she has been noted to have very loud snoring as well as witnessed apnea.  She often wakes up 3-4 times per night and has frequent nocturia.  Typically she goes to bed between 1 AM and 2 AM and wakes up at 9 AM.  Her sleep is nonrestorative.  Over the last several years.  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  She does not exercise.  She has been smoking at least one pack per day for 22 years.  She has noticed progressive weight gain.   She was  evaluated by Dr Sharee Holster and was felt to have a generalized anxiety disorder and was given a prescription for Prozac.  During her evaluation, she had diastolic hypertension with a blood pressure of 134/92.  At that time she weighed 302 pounds and BMI was 48.06 kg/m.  She was referred for evaluation.   During her initial evaluation, her Bang score was 6/8.  An Epworth Sleepiness Scale score was calculated in the office and this endorsed that 6 with a moderate chance of dozing while  sitting and reading, a high chance of dozing while lying down to rest in the afternoon when circumstances permit, and a slight chance of dozing while watching television.   When I saw Ms. Barbara Romero, I felt she had symptoms very highly suggestive of obstructive sleep apnea.  She also was hypertensive.  I scheduled her for an echo Doppler study to evaluate for hypertensive heart disease.  This was done on 04/04/2016 and showed an EF of 60-65%.  Diastolic parameters were normal.  There was mild LVH.  There is no significant valvular abnormalities.  I referred her for sleep study which was done on 04/04/2016.  This revealed very severe sleep apnea with an HI of 105.7 per hour.  She was unable to have any rim sleep.  There was loud snoring.  Her oxygen desaturation nadir was 84%.  The patient became very anxious when CPAP was attempted and as result, the PSG study was resumed.  I recommended CPAP desensitization or Evaluation which she had done.  Subsequently she received an auto Pap unit in late March and has an air since 10 set.  AutoSet CPAP.  She feels significantly improved since initiating CPAP therapy.  I obtained a download in the office today from September 19 through 02/01/2017, which shows excellent compliance.  She is averaging almost 7  hours and 30 minutes of sleep per night.  Her AHI is 0.9 per hour.  95th percentile, average pressures 13.9 and maximum average pressure 16.3.  Previously she was getting up 3-4 times per night for urination.  She now almost sleeps through the entire night were only gets up once.  An Epworth Sleepiness Scale score was recalculated in the office  and this endorsed at 4.  DME company is [facility].  During her initial evaluation, I adjusted her CPAP auto unit to minimum pressure at 8 up to maximum of 20 reduced her ramp time to 5 minutes.   Initially she was treated with valsartan for hypertension but was switched irbesartan as result of the valsartan impurity  recognition.     She was evaluated by Corine Shelter in November 2020. During his evaluation she was hypertensive and febrile.  She had lost 25 pounds the preceding year.   I saw her in a telemedicine encounter on Aug 21, 2019.  At that time she was on valsartan HCT 160/12.5 mg and her blood pressure remained stable.  She had  lost additional 14 pounds.  She typically walks 30 minutes a day as well as does gardening.  She has continued to use CPAP therapy.  A download was obtained from July 22, 2019 through Aug 20, 2019.  She is 100% compliance.  Average usage is 7 hours and 41 minutes.  AHI is excellent at 0.7/h.  Her 95th percentile pressure is 14.6 with maximum average pressure 16.6 cm of water.    When I last saw her on July 20, 2021 she was more active.   At times she does experience some mild leg discomfort while doing gardening and some mild leg swelling.  She denies any chest pain.  She has continued to use CPAP therapy and a download shows 100% compliance.  Her pressure is set at a range of 10 to 20 cm of water with AHI 1.1.  Her 95th percentile pressure 17.1 with maximum average pressure 18.7.  She presents for a 2-1/2 year follow-up evaluation.   Past Medical History:  Diagnosis Date   GERD (gastroesophageal reflux disease)    Hypertension     Past Surgical History:  Procedure Laterality Date   BACK SURGERY     L5    Current Medications: Outpatient Medications Prior to Visit  Medication Sig Dispense Refill   cholecalciferol (VITAMIN D) 1000 units tablet Take 2,000 Units by mouth daily.     tretinoin (RETIN-A) 0.025 % cream Apply a pea sized amount to the entire face QHS. 45 g 6   valsartan-hydrochlorothiazide (DIOVAN-HCT) 160-12.5 MG tablet Take 1 tablet by mouth daily. 90 tablet 1   hydrochlorothiazide (HYDRODIURIL) 25 MG tablet Take 0.5 tablets (12.5 mg total) by mouth as needed (increased swelling). 90 tablet 1   No facility-administered medications prior to visit.      Allergies:   Hydrocodone   Social History   Socioeconomic History   Marital status: Single    Spouse name: Not on file   Number of children: Not on file   Years of education: Not on file   Highest education level: Not on file  Occupational History   Not on file  Tobacco Use   Smoking status: Every Day    Current packs/day: 1.00    Average packs/day: 1 pack/day for 23.0 years (23.0 ttl pk-yrs)    Types: Cigarettes   Smokeless tobacco: Never  Substance and Sexual Activity   Alcohol use:  Yes    Comment: social   Drug use: No   Sexual activity: Not Currently    Birth control/protection: None  Other Topics Concern   Not on file  Social History Narrative   Not on file   Social Determinants of Health   Financial Resource Strain: Not on file  Food Insecurity: Not on file  Transportation Needs: Not on file  Physical Activity: Not on file  Stress: Not on file  Social Connections: Not on file     Family History:  The patient's family history includes Cancer in her father; Hypertension in her father.   ROS General: Negative; No fevers, chills, or night sweats;  HEENT: Negative; No changes in vision or hearing, sinus congestion, difficulty swallowing Pulmonary: Negative; No cough, wheezing, shortness of breath, hemoptysis Cardiovascular: Negative; No chest pain, presyncope, syncope, palpitations GI: Negative; No nausea, vomiting, diarrhea, or abdominal pain GU: Negative; No dysuria, hematuria, or difficulty voiding Musculoskeletal: Negative; no myalgias, joint pain, or weakness Hematologic/Oncology: Negative; no easy bruising, bleeding Endocrine: Negative; no heat/cold intolerance; no diabetes Neuro: Negative; no changes in balance, headaches Skin: Negative; No rashes or skin lesions Psychiatric: Negative; No behavioral problems, depression Sleep: Negative; No snoring, daytime sleepiness, hypersomnolence, bruxism, restless legs, hypnogognic hallucinations, no  cataplexy Other comprehensive 14 point system review is negative.   PHYSICAL EXAM:   VS:  BP (!) 146/88 (BP Location: Left Arm, Patient Position: Sitting, Cuff Size: Large)   Pulse 85   Ht 5\' 5"  (1.651 m)   Wt (!) 300 lb 9.6 oz (136.4 kg)   SpO2 95%   BMI 50.02 kg/m     Repeat blood pressure by me was 136/82  Wt Readings from Last 3 Encounters:  03/02/23 (!) 300 lb 9.6 oz (136.4 kg)  12/01/22 (!) 303 lb 9.6 oz (137.7 kg)  07/20/20 273 lb 6.4 oz (124 kg)    Since I last saw her in April 2022, weight has increased from 273 to current weight 300  General: Alert, oriented, no distress.  Morbid obesity Skin: normal turgor, no rashes, warm and dry HEENT: Normocephalic, atraumatic. Pupils equal round and reactive to light; sclera anicteric; extraocular muscles intact;  Nose without nasal septal hypertrophy Mouth/Parynx benign; Mallinpatti scale 3 Neck: No JVD, no carotid bruits; normal carotid upstroke Lungs: clear to ausculatation and percussion; no wheezing or rales Chest wall: without tenderness to palpitation Heart: PMI not displaced, RRR, s1 s2 normal, 1/6 systolic murmur, no diastolic murmur, no rubs, gallops, thrills, or heaves Abdomen: Central adiposity soft, nontender; no hepatosplenomehaly, BS+; abdominal aorta nontender and not dilated by palpation. Back: no CVA tenderness Pulses 2+ Musculoskeletal: full range of motion, normal strength, no joint deformities Extremities: no clubbing cyanosis or edema, Homan's sign negative  Neurologic: grossly nonfocal; Cranial nerves grossly wnl Psychologic: Normal mood and affect  Studies/Labs Reviewed:    EKG Interpretation Date/Time:  Friday March 02 2023 08:31:16 EST Ventricular Rate:  85 PR Interval:  148 QRS Duration:  80 QT Interval:  366 QTC Calculation: 435 R Axis:   4  Text Interpretation: Normal sinus rhythm Low voltage QRS Cannot rule out Anterior infarct , age undetermined When compared with ECG of 01-Dec-2022  10:15, No significant change was found Confirmed by Nicki Guadalajara ([id number]) on 03/02/2023 8:42:36 AM    July 19, 2020 ECG (independently read by me): NSR at 82, no ectopy  February 13, 2019  ECG (independently read by me): NSR at 90,no ectopy,normal intervals  Recent Labs:    Latest  Ref Rng & Units 03/02/2023    9:45 AM 12/01/2022   11:02 AM 07/20/2020    8:42 AM  BMP  Glucose 70 - 99 mg/dL 88  89  85   BUN 6 - 24 mg/dL 10  15  18    Creatinine 0.57 - 1.00 mg/dL 1.61  0.96  0.45   BUN/Creat Ratio 9 - 23 14  19  23    Sodium 134 - 144 mmol/L 138  138  137   Potassium 3.5 - 5.2 mmol/L 4.1  4.5  4.4   Chloride 96 - 106 mmol/L 103  101  102   CO2 20 - 29 mmol/L 23  23  22    Calcium 8.7 - 10.2 mg/dL 9.8  9.6  9.3         Latest Ref Rng & Units 03/02/2023    9:45 AM 07/20/2020    8:42 AM 09/03/2019    8:20 AM  Hepatic Function  Total Protein 6.0 - 8.5 g/dL 7.0  7.3  6.9   Albumin 3.9 - 4.9 g/dL 4.1  4.3  4.2   AST 0 - 40 IU/L 14  17  14    ALT 0 - 32 IU/L 11  12  12    Alk Phosphatase 44 - 121 IU/L 82  63  52   Total Bilirubin 0.0 - 1.2 mg/dL 0.5  0.3  0.5        Latest Ref Rng & Units 12/01/2022   11:02 AM 07/20/2020    8:42 AM 02/12/2019    9:36 AM  CBC  WBC 3.4 - 10.8 x10E3/uL 11.0  8.7  10.8   Hemoglobin 11.1 - 15.9 g/dL 40.9  81.1  91.4   Hematocrit 34.0 - 46.6 % 46.3  45.4  46.1   Platelets 150 - 450 x10E3/uL 273  257  257    Lab Results  Component Value Date   MCV 89 12/01/2022   MCV 90 07/20/2020   MCV 91 02/12/2019   Lab Results  Component Value Date   TSH 1.080 03/02/2023   Lab Results  Component Value Date   HGBA1C 5.4 07/20/2020     BNP No results found for: "BNP"  ProBNP No results found for: "PROBNP"   Lipid Panel     Component Value Date/Time   CHOL 200 (H) 03/02/2023 0945   TRIG 107 03/02/2023 0945   HDL 47 03/02/2023 0945   CHOLHDL 4.3 03/02/2023 0945   CHOLHDL 4.2 03/17/2016 0856   VLDL 18 03/17/2016 0856   LDLCALC 134 (H) 03/02/2023 0945    LABVLDL 19 03/02/2023 0945     RADIOLOGY: No results found.   Additional studies/ records that were reviewed today include:  A new download was obtained from March 6 through July 19, 2020.  Usage days 100%, average usage 6 hours 53 minutes; AutoSet pressure range 8 to 20 cm, AHI 0.6/h, no leak.  95th percentile pressure 15.4 maximum average pressure 17.2  ASSESSMENT:    1. OSA (obstructive sleep apnea)   2. Essential hypertension   3. Pure hypercholesterolemia   4. Morbid obesity    PLAN:  1.  Severe obstructive sleep apnea: She continues to use CPAP therapy with 100% compliance.  When last seen by me her CPAP pressure was adjusted to a range of 10 to 20 cm.  AHI today is excellent at 1.1.  She is requiring high pressures with 95th percentile pressure at 17.1 with maximum average pressure at 18.7.  There is no mask  leak.  She is averaging 7 hours and 23 minutes of sleep duration per night.  Her machine is still very functional.  Her previous DME company was [facility] which is no longer in the CPAP business.  If she has not been transitioned already she will need to be transitioned to a new DME company. She qualifies for new machine if she so desires but this will need to be set up with a new company based on her insurance.  I have recommended she received a new ResMed AirSense 11 AutoSet unit.  If she receives this, I will need to see her within 3 months of getting a new machine.  2.  Essential hypertension: Blood pressure today is stable although minimally increased at 136/82 by me.  She has continued to be on valsartan HCT 160/12.5 mg and takes an extra as needed HCTZ as needed.  Target blood pressure is less than 130/80.  3.  Morbid obesity: BMI today is significantly increased at 50.02.  Since I last saw her in April 2022 weight is increased from 273 to 300 pounds.  We discussed the importance of exercise and proper diet.  4.  Mild hyperlipidemia: Lipid study in April 2022  showed total cholesterol 193, triglycerides 88, HDL 51, and LDL 126.  I am not certain if she has had follow-up lipid studies.  She tells me she now is seeing a provider named Amil Amen but I do not know any further information.  Subsequent fasting laboratory will need to be repeated.   Medication Adjustments/Labs and Tests Ordered: Current medicines are reviewed at length with the patient today.  Concerns regarding medicines are outlined above.  Medication changes, Labs and Tests ordered today are listed in the Patient Instructions below. Patient Instructions  Medication Instructions:  No changes *If you need a refill on your cardiac medications before your next appointment, please call your pharmacy*   Lab Work: CMP, TSH, Lipid panel, LPa If you have labs (blood work) drawn today and your tests are completely normal, you will receive your results only by: MyChart Message (if you have MyChart) OR A paper copy in the mail If you have any lab test that is abnormal or we need to change your treatment, we will call you to review the results.   Follow-Up: At [facility], you and your health needs are our priority.  As part of our continuing mission to provide you with exceptional heart care, we have created designated Provider Care Teams.  These Care Teams include your primary Cardiologist (physician) and Advanced Practice Providers (APPs -  Physician Assistants and Nurse Practitioners) who all work together to provide you with the care you need, when you need it.  We recommend signing up for the patient portal called "MyChart".  Sign up information is provided on this After Visit Summary.  MyChart is used to connect with patients for Virtual Visits (Telemedicine).  Patients are able to view lab/test results, encounter notes, upcoming appointments, etc.  Non-urgent messages can be sent to your provider as well.   To learn more about what you can do with MyChart, go to  [email or web address].    Your next appointment:    Follow up 3 months after getting new machine (if you get a new machine) if you do not get a new machine, then follow up in one year  Provider:   Nicki Guadalajara, MD       Signed, Nicki Guadalajara, MD  03/13/2023 9:36 AM  [facility] [address], [place], [place]  [address] Phone: [phone]

## 2023-03-04 LAB — COMPREHENSIVE METABOLIC PANEL
ALT: 11 [IU]/L (ref 0–32)
AST: 14 IU/L (ref 0–40)
Albumin: 4.1 g/dL (ref 3.9–4.9)
Alkaline Phosphatase: 82 [IU]/L (ref 44–121)
BUN/Creatinine Ratio: 14 (ref 9–23)
BUN: 10 mg/dL (ref 6–24)
Bilirubin Total: 0.5 mg/dL (ref 0.0–1.2)
CO2: 23 mmol/L (ref 20–29)
Calcium: 9.8 mg/dL (ref 8.7–10.2)
Chloride: 103 mmol/L (ref 96–106)
Creatinine, Ser: 0.7 mg/dL (ref 0.57–1.00)
Globulin, Total: 2.9 g/dL (ref 1.5–4.5)
Glucose: 88 mg/dL (ref 70–99)
Potassium: 4.1 mmol/L (ref 3.5–5.2)
Sodium: 138 mmol/L (ref 134–144)
Total Protein: 7 g/dL (ref 6.0–8.5)
eGFR: 109 mL/min/{1.73_m2} (ref >59)

## 2023-03-04 LAB — LIPOPROTEIN A (LPA): Lipoprotein (a): 20.8 nmol/L (ref <75.0)

## 2023-03-04 LAB — LIPID PANEL
Chol/HDL Ratio: 4.3 ratio (ref 0.0–4.4)
Cholesterol, Total: 200 mg/dL — ABNORMAL HIGH (ref 100–199)
HDL: 47 mg/dL (ref >39)
LDL Chol Calc (NIH): 134 mg/dL — ABNORMAL HIGH (ref 0–99)
Triglycerides: 107 mg/dL (ref 0–149)
VLDL Cholesterol Cal: 19 mg/dL (ref 5–40)

## 2023-03-04 LAB — TSH: TSH: 1.08 u[IU]/mL (ref 0.450–4.500)

## 2023-03-13 ENCOUNTER — Encounter: Payer: Self-pay | Admitting: Cardiovascular Disease

## 2023-03-13 NOTE — Addendum Note (Signed)
Addended by: Brunetta Genera on: 03/13/2023 02:23 PM   Modules accepted: Orders

## 2023-03-13 NOTE — Progress Notes (Signed)
Late Entry: Ordered new ResMed 11, order sent to [facility]. Previously with DME company [facility].

## 2023-04-23 ENCOUNTER — Ambulatory Visit (HOSPITAL_BASED_OUTPATIENT_CLINIC_OR_DEPARTMENT_OTHER): Payer: No Typology Code available for payment source

## 2023-04-23 ENCOUNTER — Other Ambulatory Visit (HOSPITAL_BASED_OUTPATIENT_CLINIC_OR_DEPARTMENT_OTHER): Payer: Self-pay

## 2023-04-23 ENCOUNTER — Ambulatory Visit (HOSPITAL_BASED_OUTPATIENT_CLINIC_OR_DEPARTMENT_OTHER): Payer: No Typology Code available for payment source | Admitting: Student

## 2023-04-23 ENCOUNTER — Encounter (HOSPITAL_BASED_OUTPATIENT_CLINIC_OR_DEPARTMENT_OTHER): Payer: Self-pay | Admitting: Student

## 2023-04-23 DIAGNOSIS — M79604 Pain in right leg: Secondary | ICD-10-CM | POA: Diagnosis not present

## 2023-04-23 MED ORDER — METHYLPREDNISOLONE 4 MG PO TBPK
ORAL_TABLET | ORAL | 0 refills | Status: DC
  Filled 2023-04-23: qty 21, 6d supply, fill #0

## 2023-04-23 NOTE — Progress Notes (Signed)
 Chief Complaint: Right leg pain     History of Present Illness:    Barbara Romero is a 43 y.o. female presenting today for evaluation of pain in her right leg.  This began about 3 days ago notably while climbing in and out of a pickup truck.  She does have a history of an L5-S1 microdiscectomy in 2009 but denies any back pain.  States that pain travels from the thigh all the way down into the lower leg and occasionally into the foot.  Pain is significantly worsened when laying supine.  Rates pain at a 7/10 and describes this as sharp and throbbing.  Has intermittent tingling down the leg as well.  Has tried ice, heat, Tylenol , and IcyHot.   Surgical History:   L5-S1 Microdiscectomy - 2009  PMH/PSH/Family History/Social History/Meds/Allergies:    Past Medical History:  Diagnosis Date   GERD (gastroesophageal reflux disease)    Hypertension    Past Surgical History:  Procedure Laterality Date   BACK SURGERY     L5   Social History   Socioeconomic History   Marital status: Single    Spouse name: Not on file   Number of children: Not on file   Years of education: Not on file   Highest education level: Not on file  Occupational History   Not on file  Tobacco Use   Smoking status: Every Day    Current packs/day: 1.00    Average packs/day: 1 pack/day for 23.0 years (23.0 ttl pk-yrs)    Types: Cigarettes   Smokeless tobacco: Never  Substance and Sexual Activity   Alcohol use: Yes    Comment: social   Drug use: No   Sexual activity: Not Currently    Birth control/protection: None  Other Topics Concern   Not on file  Social History Narrative   Not on file   Social Drivers of Health   Financial Resource Strain: Not on file  Food Insecurity: Not on file  Transportation Needs: Not on file  Physical Activity: Not on file  Stress: Not on file  Social Connections: Not on file   Family History  Problem Relation Age of Onset   Cancer  Father        prostate   Hypertension Father    Allergies  Allergen Reactions   Hydrocodone Nausea Only and Other (See Comments)    Disoriented    Current Outpatient Medications  Medication Sig Dispense Refill   methylPREDNISolone  (MEDROL  DOSEPAK) 4 MG TBPK tablet Take per packet instructions 21 each 0   cholecalciferol (VITAMIN D ) 1000 units tablet Take 2,000 Units by mouth daily.     hydrochlorothiazide  (HYDRODIURIL ) 25 MG tablet Take 0.5 tablets (12.5 mg total) by mouth as needed (increased swelling). 90 tablet 1   tretinoin  (RETIN-A ) 0.025 % cream Apply a pea sized amount to the entire face QHS. 45 g 6   valsartan -hydrochlorothiazide  (DIOVAN -HCT) 160-12.5 MG tablet Take 1 tablet by mouth daily. 90 tablet 1   No current facility-administered medications for this visit.   No results found.  Review of Systems:   A ROS was performed including pertinent positives and negatives as documented in the HPI.  Physical Exam :   Constitutional: NAD and appears stated age Neurological: Alert and oriented Psych: Appropriate affect and cooperative There were no  vitals taken for this visit.   Comprehensive Musculoskeletal Exam:    No tenderness to palpation throughout the lumbar spine or paraspinal muscles.  Passive hip range of motion to 110 degrees flexion, 30 degrees external rotation, and 20 degrees internal rotation bilaterally without pain.  Negative Faber and straight leg raise bilaterally.  Knee flexion/extension and ankle dorsiflexion/plantarflexion strength is 5/5.  Patellar reflexes 2+ and equal.  Imaging:   Xray (lumbar spine 4 views): Mild wedge deformity of L1 vertebral body.  Significantly decreased disc space at prior surgical site of L5-S1.  Otherwise negative for any acute abnormality.   I personally reviewed and interpreted the radiographs.   Assessment:   43 y.o. female with pain down the right leg that appears consistent with lumbar radiculopathy.  Symptoms  generally follow an L5-S1 pattern and she does have prior history of an L5-S1 microdiscectomy with significant narrowing of the disc space on today's x-rays.  No red flag symptoms.  Discussed that I would recommend initial conservative treatment for which I will start her on a Medrol  Dosepak.  Will have her monitor symptoms for improvement and should symptoms worsen or persist would likely consider referral to physical therapy.  Plan :    - Start Medrol  Dosepak - Return to clinic as needed     I personally saw and evaluated the patient, and participated in the management and treatment plan.  Leonce Reveal, PA-C Orthopedics

## 2023-05-23 ENCOUNTER — Other Ambulatory Visit: Payer: Self-pay | Admitting: Adult Health

## 2023-06-12 ENCOUNTER — Ambulatory Visit: Payer: No Typology Code available for payment source | Admitting: Dermatology

## 2023-07-03 ENCOUNTER — Ambulatory Visit (HOSPITAL_BASED_OUTPATIENT_CLINIC_OR_DEPARTMENT_OTHER)

## 2023-07-03 ENCOUNTER — Other Ambulatory Visit (HOSPITAL_BASED_OUTPATIENT_CLINIC_OR_DEPARTMENT_OTHER): Payer: Self-pay

## 2023-07-03 ENCOUNTER — Ambulatory Visit (HOSPITAL_BASED_OUTPATIENT_CLINIC_OR_DEPARTMENT_OTHER): Admitting: Student

## 2023-07-03 DIAGNOSIS — M546 Pain in thoracic spine: Secondary | ICD-10-CM

## 2023-07-03 DIAGNOSIS — M25511 Pain in right shoulder: Secondary | ICD-10-CM

## 2023-07-03 DIAGNOSIS — M792 Neuralgia and neuritis, unspecified: Secondary | ICD-10-CM

## 2023-07-03 MED ORDER — METHYLPREDNISOLONE 4 MG PO TBPK
ORAL_TABLET | ORAL | 0 refills | Status: DC
  Filled 2023-07-03: qty 21, 6d supply, fill #0

## 2023-07-03 NOTE — Progress Notes (Signed)
 Chief Complaint: Pain in the upper back     History of Present Illness:    Barbara Romero is a 43 y.o. female presenting today for evaluation of right sided upper back pain.  She states that this began about 5 days ago with no known injury or cause.  Reports that pain is located mainly around the shoulder blade area but does occasionally radiate around to the front of the chest.  Pain has been affecting her ability to sleep through the night.  Denies any numbness or tingling.  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  Does have history of lumbar issues including an L5-S1 microdiscectomy in 2009.  Was treated back in January for low back pain and sciatic symptoms which quickly resolved with a Medrol Dosepak.  Has been taking aspirin and Robaxin for her current episode.   Surgical History:   L5-S1 microdiscectomy 2009  PMH/PSH/Family History/Social History/Meds/Allergies:    Past Medical History:  Diagnosis Date   GERD (gastroesophageal reflux disease)    Hypertension    Past Surgical History:  Procedure Laterality Date   BACK SURGERY     L5   Social History   Socioeconomic History   Marital status: Single    Spouse name: Not on file   Number of children: Not on file   Years of education: Not on file   Highest education level: Not on file  Occupational History   Not on file  Tobacco Use   Smoking status: Every Day    Current packs/day: 1.00    Average packs/day: 1 pack/day for 23.0 years (23.0 ttl pk-yrs)    Types: Cigarettes   Smokeless tobacco: Never  Substance and Sexual Activity   Alcohol use: Yes    Comment: social   Drug use: No   Sexual activity: Not Currently    Birth control/protection: None  Other Topics Concern   Not on file  Social History Narrative   Not on file   Social Drivers of Health   Financial Resource Strain: Not on  file  Food Insecurity: Not on file  Transportation Needs: Not on file  Physical Activity: Not on file  Stress: Not on file  Social Connections: Not on file   Family History  Problem Relation Age of Onset   Cancer Father        prostate   Hypertension Father    Allergies  Allergen Reactions   Hydrocodone Nausea Only and Other (See Comments)    Disoriented    Current Outpatient Medications  Medication Sig Dispense Refill   methylPREDNISolone (MEDROL DOSEPAK) 4 MG TBPK tablet Take per packet instructions 21 each 0   cholecalciferol (VITAMIN D) 1000 units tablet Take 2,000 Units by mouth daily.     hydrochlorothiazide (HYDRODIURIL) 25 MG tablet Take 0.5 tablets (12.5 mg total) by mouth as needed (increased swelling). 90 tablet 1   tretinoin (RETIN-A) 0.025 % cream Apply a pea sized amount to the entire face QHS. 45 g 6   valsartan-hydrochlorothiazide (DIOVAN-HCT) 160-12.5 MG tablet Take 1 tablet by mouth daily. 90 tablet 1   No current facility-administered medications for this visit.   No results found.  Review of Systems:   A ROS was performed including pertinent positives and  negatives as documented in the HPI.  Physical Exam :   Constitutional: NAD and appears stated age Neurological: Alert and oriented Psych: Appropriate affect and cooperative There were no vitals taken for this visit.   Comprehensive Musculoskeletal Exam:    No tenderness with palpation of the cervical spine or paraspinal muscles.  Full range of motion with cervical flexion, extension, and bilateral rotation.  Negative Spurling's.  No midline tenderness along the thoracic spine.  Tenderness throughout the scapular region.  Full shoulder ROM bilaterally with flexion and external rotation.  Grip strength 5/5 and equal.  Imaging:   Xray (cervical spine 4 views, thoracic spine 3 views): Normal lordotic alignment within the cervical spine without evidence of acute bony abnormality and well maintained  intervertebral spacing.  Normal alignment within the thoracic spine without acute abnormality.   I personally reviewed and interpreted the radiographs.   Assessment:   43 y.o. female with 5-day history of pain in the right upper thoracic and scapular region.  No precipitating injury or overuse.  Her symptoms today are suggestive of a disc herniation within the thoracic spine which she has been diagnosed with previously, unsure of what level.  Recommend proceeding with conservative treatment for which I will begin her on a Medrol Dosepak today as this helped tremendously few months ago with her low back issues.  Discussed that I do believe she would be a good candidate for physical therapy if needed, however plan will be to monitor symptoms over the coming days and decide at that point on PT if symptoms are persisting.  Plan :    -Start Medrol Dosepak and consider referral to physical therapy if symptoms do not resolve     I personally saw and evaluated the patient, and participated in the management and treatment plan.  Hazle Nordmann, PA-C Orthopedics

## 2023-07-08 ENCOUNTER — Encounter (HOSPITAL_BASED_OUTPATIENT_CLINIC_OR_DEPARTMENT_OTHER): Payer: Self-pay

## 2023-10-25 ENCOUNTER — Other Ambulatory Visit: Payer: Self-pay | Admitting: Dermatology

## 2023-11-20 ENCOUNTER — Other Ambulatory Visit: Payer: Self-pay

## 2023-11-20 MED ORDER — VALSARTAN-HYDROCHLOROTHIAZIDE 160-12.5 MG PO TABS: 1.0000 | ORAL_TABLET | Freq: Every day | ORAL | 0 refills | Status: DC

## 2024-02-18 ENCOUNTER — Encounter: Payer: Self-pay | Admitting: Radiology

## 2024-02-19 ENCOUNTER — Other Ambulatory Visit: Payer: Self-pay | Admitting: Adult Health

## 2024-03-08 ENCOUNTER — Other Ambulatory Visit: Payer: Self-pay | Admitting: Adult Health

## 2024-05-02 ENCOUNTER — Ambulatory Visit

## 2024-05-02 VITALS — BP 123/79 | HR 95 | Temp 98.4°F | Ht 65.0 in | Wt 301.0 lb

## 2024-05-02 DIAGNOSIS — G4733 Obstructive sleep apnea (adult) (pediatric): Secondary | ICD-10-CM

## 2024-05-02 DIAGNOSIS — N924 Excessive bleeding in the premenopausal period: Secondary | ICD-10-CM | POA: Diagnosis not present

## 2024-05-02 DIAGNOSIS — Z23 Encounter for immunization: Secondary | ICD-10-CM

## 2024-05-02 DIAGNOSIS — Z1159 Encounter for screening for other viral diseases: Secondary | ICD-10-CM | POA: Diagnosis not present

## 2024-05-02 DIAGNOSIS — I10 Essential (primary) hypertension: Secondary | ICD-10-CM | POA: Diagnosis not present

## 2024-05-02 DIAGNOSIS — Z6841 Body Mass Index (BMI) 40.0 and over, adult: Secondary | ICD-10-CM | POA: Diagnosis not present

## 2024-05-02 MED ORDER — SEMAGLUTIDE-WEIGHT MANAGEMENT 4 MG PO TABS: 4.0000 mg | ORAL_TABLET | Freq: Every day | ORAL | 0 refills | Status: DC

## 2024-05-02 MED ORDER — FUROSEMIDE 20 MG PO TABS: ORAL_TABLET | ORAL | 0 refills | Status: AC

## 2024-05-02 MED ORDER — SEMAGLUTIDE-WEIGHT MANAGEMENT 1.5 MG PO TABS: 1.5000 mg | ORAL_TABLET | Freq: Every day | ORAL | 0 refills | Status: DC

## 2024-05-02 MED ORDER — VALSARTAN-HYDROCHLOROTHIAZIDE 160-12.5 MG PO TABS: 1.0000 | ORAL_TABLET | Freq: Every day | ORAL | 2 refills | Status: AC

## 2024-05-02 NOTE — Patient Instructions (Signed)
 VISIT SUMMARY: Today we discussed your weight loss options, managed your hypertension, addressed your heavy menstrual bleeding, and administered a tetanus vaccine.  YOUR PLAN: OBESITY: We discussed weight loss options, focusing on Wegovy  and phentermine. Wegovy  is preferred due to fewer stimulant-related side effects. -Start Wegovy  at 4 mg, with gradual dose increase as tolerated. -Provided information on Wegovy  cost and dosing schedule.  HYPERTENSION: Your blood pressure is well-controlled on your current medication. -Refilled valsartan  hydrochlorothiazide  160/12.5 mg for 90 days.  HEAVY MENSTRUAL BLEEDING: Your heavy menstrual bleeding may be linked to perimenopause. We will evaluate your hormone levels. -Ordered hormone level tests with other lab work. -Consider a transvaginal ultrasound if hormone levels indicate abnormalities. -Discuss potential treatment options such as birth control for period control if indicated.  ENCOUNTER FOR IMMUNIZATION: Your tetanus vaccine was overdue. -Administered tetanus vaccine today.  If you have any problems before your next visit feel free to message me via MyChart (minor issues or questions) or call the office, otherwise you may reach out to schedule an office visit.  Thank you! Saddie Sacks, PA-C

## 2024-05-04 DIAGNOSIS — N920 Excessive and frequent menstruation with regular cycle: Secondary | ICD-10-CM | POA: Insufficient documentation

## 2024-05-04 LAB — COMPREHENSIVE METABOLIC PANEL WITH GFR
ALT: 12 IU/L (ref 0–32)
AST: 16 IU/L (ref 0–40)
Albumin: 4.3 g/dL (ref 3.9–4.9)
Alkaline Phosphatase: 76 IU/L (ref 41–116)
BUN/Creatinine Ratio: 19 (ref 9–23)
BUN: 14 mg/dL (ref 6–24)
Bilirubin Total: 0.4 mg/dL (ref 0.0–1.2)
CO2: 22 mmol/L (ref 20–29)
Calcium: 9.7 mg/dL (ref 8.7–10.2)
Chloride: 101 mmol/L (ref 96–106)
Creatinine, Ser: 0.75 mg/dL (ref 0.57–1.00)
Globulin, Total: 3.2 g/dL (ref 1.5–4.5)
Glucose: 94 mg/dL (ref 70–99)
Potassium: 4.2 mmol/L (ref 3.5–5.2)
Sodium: 139 mmol/L (ref 134–144)
Total Protein: 7.5 g/dL (ref 6.0–8.5)
eGFR: 100 mL/min/1.73

## 2024-05-04 LAB — CBC WITH DIFFERENTIAL/PLATELET
Basophils Absolute: 0.1 x10E3/uL (ref 0.0–0.2)
Basos: 1 %
EOS (ABSOLUTE): 0.2 x10E3/uL (ref 0.0–0.4)
Eos: 2 %
Hematocrit: 48.6 % — ABNORMAL HIGH (ref 34.0–46.6)
Hemoglobin: 16.4 g/dL — ABNORMAL HIGH (ref 11.1–15.9)
Immature Grans (Abs): 0 x10E3/uL (ref 0.0–0.1)
Immature Granulocytes: 0 %
Lymphocytes Absolute: 2.7 x10E3/uL (ref 0.7–3.1)
Lymphs: 28 %
MCH: 30.9 pg (ref 26.6–33.0)
MCHC: 33.7 g/dL (ref 31.5–35.7)
MCV: 92 fL (ref 79–97)
Monocytes Absolute: 0.7 x10E3/uL (ref 0.1–0.9)
Monocytes: 7 %
Neutrophils Absolute: 6 x10E3/uL (ref 1.4–7.0)
Neutrophils: 62 %
Platelets: 283 x10E3/uL (ref 150–450)
RBC: 5.31 x10E6/uL — ABNORMAL HIGH (ref 3.77–5.28)
RDW: 12.9 % (ref 11.7–15.4)
WBC: 9.6 x10E3/uL (ref 3.4–10.8)

## 2024-05-04 LAB — HEMOGLOBIN A1C
Est. average glucose Bld gHb Est-mCnc: 117 mg/dL
Hgb A1c MFr Bld: 5.7 % — ABNORMAL HIGH (ref 4.8–5.6)

## 2024-05-04 LAB — LIPID PANEL
Chol/HDL Ratio: 3.9 ratio (ref 0.0–4.4)
Cholesterol, Total: 210 mg/dL — ABNORMAL HIGH (ref 100–199)
HDL: 54 mg/dL
LDL Chol Calc (NIH): 140 mg/dL — ABNORMAL HIGH (ref 0–99)
Triglycerides: 88 mg/dL (ref 0–149)
VLDL Cholesterol Cal: 16 mg/dL (ref 5–40)

## 2024-05-04 LAB — PROGESTERONE: Progesterone: 0.3 ng/mL

## 2024-05-04 LAB — TSH: TSH: 1.12 u[IU]/mL (ref 0.450–4.500)

## 2024-05-04 LAB — ESTRADIOL: Estradiol: 46.8 pg/mL

## 2024-05-04 LAB — HIV ANTIBODY (ROUTINE TESTING W REFLEX): HIV Screen 4th Generation wRfx: NONREACTIVE

## 2024-05-04 LAB — FSH/LH
FSH: 23 m[IU]/mL
LH: 29.7 m[IU]/mL

## 2024-05-04 LAB — VITAMIN D 25 HYDROXY (VIT D DEFICIENCY, FRACTURES): Vit D, 25-Hydroxy: 39.5 ng/mL (ref 30.0–100.0)

## 2024-05-04 LAB — HEPATITIS C ANTIBODY: Hep C Virus Ab: NONREACTIVE

## 2024-05-04 NOTE — Assessment & Plan Note (Signed)
 BP Goal <130/80. BP stable at goal. Continue valsartan -hydrochlorothiazide  160-12.5 mg daily. Checking CMP with labs. Will cont to monitor

## 2024-05-04 NOTE — Assessment & Plan Note (Signed)
-   Compliant with CPAP.

## 2024-05-04 NOTE — Progress Notes (Signed)
 "  New Patient Office Visit  Subjective    Patient ID: Barbara Romero, female    DOB: [birth year]  Age: 44 y.o. MRN: [id number]  CC:  Chief Complaint  Patient presents with   New Patient (Initial Visit)    Discussed the use of AI scribe software for clinical note transcription with the patient, who gave verbal consent to proceed.  History of Present Illness   Barbara Romero is a 44 year old female who presents for a primary care visit and weight loss consultation.  Hypertension - Managed with valsartan  hydrochlorothiazide  160 mg/12.5 mg daily - Previously followed by a cardiologist who has retired. No longer following with cardiology.   Obstructive sleep apnea - Uses CPAP nightly with good results for the past five years  Abnormal uterine bleeding - Heavy menstrual periods for the past five years, associated with initiation of valsartan  - Menstrual cycles are irregular, with episodes of heavy flow, occasional pauses and resumption, and increased frequency - No associated pain  Weight management - Interested in weight loss options - Previously used phentermine - Considering oral weight loss medications - Not open to injections but had heard about oral Wegovy  and wants to discuss today  Tobacco use - Smokes one pack of cigarettes daily since age 70 (approximately 30 years) - Not interested in smoking cessation at this time  Preventive health maintenance - No prior mammogram, Pap smear, or colon cancer screening - Considering initiation of these preventive screenings       Screenings:  Colon Cancer: Options discussed; patient undecided at this time Lung Cancer: NA Breast Cancer: Patient undecided at this time  Cervical Cancer: Undecided at this time Diabetes: Checking A1c with labs HLD: Checking lipid panel with labs The 10-year ASCVD risk score (Arnett DK, et al., 2019) is: 3.8%   Outpatient Encounter Medications as of 05/02/2024  Medication Sig   furosemide   (LASIX ) 20 MG tablet Take one tablet by mouth daily as needed for lower leg swelling   semaglutide -weight management (WEGOVY ) 1.5 MG tablet Take 1 tablet (1.5 mg total) by mouth daily. Daily in AM on an empty stomach with 4 oz of water. Do not eat or drink for 30 minutes after dose.   tretinoin  (RETIN-A ) 0.05 % cream Apply topically at bedtime.   [DISCONTINUED] semaglutide -weight management (WEGOVY ) 4 MG tablet Take 1 tablet (4 mg total) by mouth daily. Daily in morning on an empty stomach with 4 oz of water. Do not eat or drink for 30 minutes after dose.   cholecalciferol (VITAMIN D ) 1000 units tablet Take 2,000 Units by mouth daily.   valsartan -hydrochlorothiazide  (DIOVAN -HCT) 160-12.5 MG tablet Take 1 tablet by mouth daily.   [DISCONTINUED] hydrochlorothiazide  (HYDRODIURIL ) 25 MG tablet Take 0.5 tablets (12.5 mg total) by mouth as needed (increased swelling).   [DISCONTINUED] methylPREDNISolone  (MEDROL  DOSEPAK) 4 MG TBPK tablet Take per packet instructions   [DISCONTINUED] tretinoin  (RETIN-A ) 0.025 % cream Apply a pea sized amount to the entire face QHS.   [DISCONTINUED] valsartan -hydrochlorothiazide  (DIOVAN -HCT) 160-12.5 MG tablet Take 1 tablet by mouth once daily   No facility-administered encounter medications on file as of 05/02/2024.    Past Medical History:  Diagnosis Date   GERD (gastroesophageal reflux disease)    Hypertension     Past Surgical History:  Procedure Laterality Date   BACK SURGERY     L5    Family History  Problem Relation Age of Onset   Cancer Father        prostate  Hypertension Father     Social History   Socioeconomic History   Marital status: Single    Spouse name: Not on file   Number of children: Not on file   Years of education: Not on file   Highest education level: Bachelor's degree (e.g., BA, AB, BS)  Occupational History   Occupation: [employer]  Tobacco Use   Smoking status: Every Day    Current packs/day: 1.00     Average packs/day: 1 pack/day for 23.0 years (23.0 ttl pk-yrs)    Types: Cigarettes   Smokeless tobacco: Never  Substance and Sexual Activity   Alcohol use: Not Currently    Comment: social   Drug use: No   Sexual activity: Not Currently    Birth control/protection: None  Other Topics Concern   Not on file  Social History Narrative   Not on file   Social Drivers of Health   Tobacco Use: High Risk (05/02/2024)   Patient History    Smoking Tobacco Use: Every Day    Smokeless Tobacco Use: Never    Passive Exposure: Not on file  Financial Resource Strain: Low Risk (04/28/2024)   Overall Financial Resource Strain (CARDIA)    Difficulty of Paying Living Expenses: Not very hard  Food Insecurity: No Food Insecurity (04/28/2024)   Epic    Worried About Programme Researcher, Broadcasting/film/video in the Last Year: Never true    Ran Out of Food in the Last Year: Never true  Transportation Needs: No Transportation Needs (04/28/2024)   Epic    Lack of Transportation (Medical): No    Lack of Transportation (Non-Medical): No  Physical Activity: Unknown (04/28/2024)   Exercise Vital Sign    Days of Exercise per Week: Patient declined    Minutes of Exercise per Session: Not on file  Stress: No Stress Concern Present (04/28/2024)   [employer] of Occupational Health - Occupational Stress Questionnaire    Feeling of Stress: Only a little  Social Connections: Socially Isolated (04/28/2024)   Social Connection and Isolation Panel    Frequency of Communication with Friends and Family: Twice a week    Frequency of Social Gatherings with Friends and Family: Three times a week    Attends Religious Services: Never    Active Member of Clubs or Organizations: No    Attends Banker Meetings: Not on file    Marital Status: Never married  Intimate Partner Violence: Not on file  Depression (PHQ2-9): Low Risk (05/02/2024)   Depression (PHQ2-9)    PHQ-2 Score: 0  Alcohol Screen: Low Risk (04/28/2024)   Alcohol  Screen    Last Alcohol Screening Score (AUDIT): 0  Housing: Low Risk (04/28/2024)   Epic    Unable to Pay for Housing in the Last Year: No    Number of Times Moved in the Last Year: 0    Homeless in the Last Year: No  Utilities: Not on file  Health Literacy: Not on file    ROS  Per HPI      Objective    BP 123/79   Pulse 95   Temp 98.4 F (36.9 C) (Oral)   Ht 5' 5 (1.651 m)   Wt (!) 301 lb (136.5 kg)   LMP 04/25/2024   SpO2 97%   BMI 50.09 kg/m   Physical Exam Constitutional:      General: She is not in acute distress.    Appearance: Normal appearance.  Cardiovascular:     Rate and Rhythm: Normal  rate and regular rhythm.     Heart sounds: Normal heart sounds. No murmur heard.    No friction rub. No gallop.  Pulmonary:     Effort: Pulmonary effort is normal. No respiratory distress.     Breath sounds: Normal breath sounds.  Musculoskeletal:     Right lower leg: Edema present.     Left lower leg: Edema present.  Skin:    General: Skin is warm and dry.  Neurological:     General: No focal deficit present.     Mental Status: She is alert.  Psychiatric:        Mood and Affect: Mood normal.        Behavior: Behavior normal.        Thought Content: Thought content normal.         Assessment & Plan:   Encounter for vaccination -     Tdap vaccine greater than or equal to 7yo IM  BMI 50.0-59.9, adult (HCC) -     VITAMIN D  25 Hydroxy (Vit-D Deficiency, Fractures); Future -     CBC with Differential/Platelet; Future -     Hemoglobin A1c; Future -     Comprehensive metabolic panel with GFR; Future -     Lipid panel; Future -     TSH; Future -     Semaglutide -Weight Management; Take 1 tablet (1.5 mg total) by mouth daily. Daily in AM on an empty stomach with 4 oz of water. Do not eat or drink for 30 minutes after dose.  Dispense: 30 tablet; Refill: 0  Excessive bleeding in premenopausal period Assessment & Plan: Heavy menstrual bleeding possibly linked to  perimenopause. Hormone levels to be evaluated. - Ordered hormone level tests with other lab work. - Consider transvaginal ultrasound if hormone levels indicate abnormalities or if she begins to experience pelvic pain. - Discuss potential treatment options such as birth control for period control if indicated.  Orders: -     Estradiol ; Future -     FSH/LH; Future -     Progesterone   Screening for viral disease -     HIV Antibody (routine testing w rflx); Future -     Hepatitis C antibody; Future  Essential hypertension Assessment & Plan: BP Goal <130/80. BP stable at goal. Continue valsartan -hydrochlorothiazide  160-12.5 mg daily. Checking CMP with labs. Will cont to monitor  Orders: -     Valsartan -hydroCHLOROthiazide ; Take 1 tablet by mouth daily.  Dispense: 90 tablet; Refill: 2  Morbid obesity Assessment & Plan: BMI 50.  Start Wegovy  1.5 mg PO daily with self pay option. Gradual dose increase as tolerated. - Provided information on Wegovy  cost and dosing schedule. - Take in Am away from other meds/vitamins and wait 30 min before eating   OSA (obstructive sleep apnea) Assessment & Plan: Compliant with CPAP   Other orders -     Furosemide ; Take one tablet by mouth daily as needed for lower leg swelling  Dispense: 30 tablet; Refill: 0    Return in about 3 months (around 07/31/2024) for Weight, HTN.   Saddie JULIANNA Sacks, PA-C  "

## 2024-05-04 NOTE — Assessment & Plan Note (Signed)
 BMI 50.  Start Wegovy  1.5 mg PO daily with self pay option. Gradual dose increase as tolerated. - Provided information on Wegovy  cost and dosing schedule. - Take in Am away from other meds/vitamins and wait 30 min before eating

## 2024-05-04 NOTE — Assessment & Plan Note (Signed)
 Heavy menstrual bleeding possibly linked to perimenopause. Hormone levels to be evaluated. - Ordered hormone level tests with other lab work. - Consider transvaginal ultrasound if hormone levels indicate abnormalities or if she begins to experience pelvic pain. - Discuss potential treatment options such as birth control for period control if indicated.

## 2024-05-11 ENCOUNTER — Ambulatory Visit: Payer: Self-pay

## 2024-05-11 DIAGNOSIS — Z6841 Body Mass Index (BMI) 40.0 and over, adult: Secondary | ICD-10-CM

## 2024-05-23 MED ORDER — WEGOVY 4 MG PO TABS: 4.0000 mg | ORAL_TABLET | Freq: Every day | ORAL | 2 refills | Status: AC

## 2024-08-01 ENCOUNTER — Ambulatory Visit
